# Patient Record
Sex: Female | Born: 1995 | Race: White | Hispanic: No | Marital: Single | Smoking: Never smoker
Health system: Southern US, Community
[De-identification: ages and names within clinical notes are randomized; demographics above are authoritative.]

## PROBLEM LIST (undated history)

## (undated) ENCOUNTER — Emergency Department: Admission: EM | Payer: Commercial Managed Care - POS | Source: Home / Self Care

## (undated) DIAGNOSIS — F32A Depression, unspecified: Secondary | ICD-10-CM

## (undated) DIAGNOSIS — J342 Deviated nasal septum: Secondary | ICD-10-CM

## (undated) DIAGNOSIS — U071 COVID-19: Secondary | ICD-10-CM

## (undated) DIAGNOSIS — F419 Anxiety disorder, unspecified: Secondary | ICD-10-CM

## (undated) DIAGNOSIS — R55 Syncope and collapse: Secondary | ICD-10-CM

## (undated) DIAGNOSIS — N83209 Unspecified ovarian cyst, unspecified side: Secondary | ICD-10-CM

## (undated) HISTORY — PX: TONSILLECTOMY: SUR1361

## (undated) HISTORY — DX: Unspecified ovarian cyst, unspecified side: N83.209

## (undated) HISTORY — DX: Depression, unspecified: F32.A

## (undated) HISTORY — PX: CHOLECYSTECTOMY: SHX55

---
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
## 2017-05-11 ENCOUNTER — Encounter: Payer: Self-pay | Admitting: Emergency Medicine

## 2017-05-11 ENCOUNTER — Emergency Department: Payer: Managed Care, Other (non HMO)

## 2017-05-11 ENCOUNTER — Emergency Department
Admission: EM | Admit: 2017-05-11 | Discharge: 2017-05-11 | Disposition: A | Discharge status: Home / Self Care | Payer: Managed Care, Other (non HMO) | Attending: Emergency Medicine | Admitting: Emergency Medicine

## 2017-05-11 DIAGNOSIS — J069 Acute upper respiratory infection, unspecified: Secondary | ICD-10-CM | POA: Insufficient documentation

## 2017-05-11 DIAGNOSIS — B9789 Other viral agents as the cause of diseases classified elsewhere: Secondary | ICD-10-CM

## 2017-05-11 DIAGNOSIS — B349 Viral infection, unspecified: Secondary | ICD-10-CM | POA: Diagnosis not present

## 2017-05-11 DIAGNOSIS — J029 Acute pharyngitis, unspecified: Secondary | ICD-10-CM | POA: Diagnosis present

## 2017-05-11 MED ORDER — ONDANSETRON 4 MG PO TBDP
4.0000 mg | ORAL_TABLET | Freq: Once | ORAL | Status: AC
  Administered 2017-05-11: 4 mg via ORAL
  Filled 2017-05-11: qty 1

## 2017-05-11 MED ORDER — FLUTICASONE PROPIONATE 50 MCG/ACT NA SUSP: 2.0000 | Freq: Every day | NASAL | 0 refills | Status: DC

## 2017-05-11 MED ORDER — DEXAMETHASONE SODIUM PHOSPHATE 10 MG/ML IJ SOLN
10.0000 mg | Freq: Once | INTRAMUSCULAR | Status: AC
  Administered 2017-05-11: 10 mg via INTRAMUSCULAR
  Filled 2017-05-11: qty 1

## 2017-05-11 MED ORDER — PREDNISONE 10 MG (21) PO TBPK: ORAL_TABLET | ORAL | 0 refills | Status: DC

## 2017-05-11 MED ORDER — BENZONATATE 100 MG PO CAPS: ORAL_CAPSULE | ORAL | 0 refills | Status: DC

## 2017-05-11 MED ORDER — ONDANSETRON 4 MG PO TBDP: 4.0000 mg | ORAL_TABLET | Freq: Three times a day (TID) | ORAL | 0 refills | Status: DC | PRN

## 2017-05-11 MED ORDER — ALBUTEROL SULFATE HFA 108 (90 BASE) MCG/ACT IN AERS: 2.0000 | INHALATION_SPRAY | RESPIRATORY_TRACT | 0 refills | Status: DC | PRN

## 2017-05-11 MED ORDER — IPRATROPIUM-ALBUTEROL 0.5-2.5 (3) MG/3ML IN SOLN
3.0000 mL | Freq: Once | RESPIRATORY_TRACT | Status: AC
  Administered 2017-05-11: 3 mL via RESPIRATORY_TRACT
  Filled 2017-05-11: qty 3

## 2017-05-11 NOTE — Discharge Instructions (Signed)
Your exam and chest x-ray are negative for pneumonia or any other infectious process. You may be experiencing a viral infection. Take the prescription meds as directed. Start an OTC decongestant (pseudoephedrine) and consider a daily non-drowsy allergy medicine (Allegra, Claritin, or Zyrtec). You could take Benadryl (diphenhydramine) at bedtime for sleep aid and nasal drainage relief. Follow-up with [facility] for surgical consultation.

## 2017-05-11 NOTE — ED Triage Notes (Signed)
Pt states sore throat and sinus pressure for 2 weeks now. No distress noted.

## 2017-05-11 NOTE — ED Notes (Signed)
Patient presents to the ED with low grade fever and sore throat x 2 weeks.  Patient reports pain with swallowing.  Patient states she has had her tonsils removed.  Patient reports history of frequent sinus infections but states this feels worse.

## 2017-05-11 NOTE — ED Provider Notes (Signed)
[facility] Emergency Department Provider Note ____________________________________________  Time seen: 1604  I have reviewed the triage vital signs and the nursing notes.  HISTORY  Chief Complaint  Sore Throat and Facial Pain  HPI Karen David is a 21 y.o. female presents to the ED for evaluation of a 2-week complaint of sore throat,sinus pressure, post-nasal drainage, and intermittently productive cough. She describes she's also had some blood-tinged sputum associated with a productive cough.Patient denies any interim frank fevers, but does note body aches, fatigue, chills, and loss of appetite. He is been taking Mucinex over-the-counter, Tylenol, Motrin, and use saline nasal flush. He is a Consulting civil engineer at [employer] but denies any recent travel, sick contacts, or other exposures. She reports that the health clinic at the school has a pending Monospot test which was drawn today. Denies any vomiting, diarrhea, abdominal pain, or chest pain. She does report nausea.   History reviewed. No pertinent past medical history.  There are no active problems to display for this patient.   Past Surgical History:  Procedure Laterality Date  . CHOLECYSTECTOMY    . TONSILLECTOMY      Prior to Admission medications   Medication Sig Start Date End Date Taking? Authorizing Provider  albuterol (PROVENTIL HFA;VENTOLIN HFA) 108 (90 Base) MCG/ACT inhaler Inhale 2 puffs into the lungs every 4 (four) hours as needed for wheezing or shortness of breath. 05/11/17   Rosslyn Pasion, Charlesetta Ivory, PA-C  benzonatate (TESSALON PERLES) 100 MG capsule Take 1-2 tabs TID prn cough 05/11/17   Jonel Sick, Charlesetta Ivory, PA-C  fluticasone (FLONASE) 50 MCG/ACT nasal spray Place 2 sprays into both nostrils daily. 05/11/17   Maryjean Corpening, Charlesetta Ivory, PA-C  ondansetron (ZOFRAN ODT) 4 MG disintegrating tablet Take 1 tablet (4 mg total) by mouth every 8 (eight) hours as needed. 05/11/17   Genni Buske, Charlesetta Ivory, PA-C   predniSONE (STERAPRED UNI-PAK 21 TAB) 10 MG (21) TBPK tablet 6-day taper as directed. 05/11/17   Domique Reardon, Charlesetta Ivory, PA-C    Allergies Patient has no known allergies.  No family history on file.  Social History Social History  Substance Use Topics  . Smoking status: Never Smoker  . Smokeless tobacco: Never Used  . Alcohol use No    Review of Systems  Constitutional: Negative for fever. Reports chills Eyes: Negative for visual changes. ENT: Positive for sore throat, nasal congestion, and sinus pressure. Cardiovascular: Negative for chest pain. Respiratory: Negative for shortness of breath. Reports productive cough Gastrointestinal: Negative for abdominal pain, vomiting and diarrhea. Genitourinary: Negative for dysuria. Musculoskeletal: Negative for back pain. Skin: Negative for rash. Neurological: Negative for headaches, focal weakness or numbness. ____________________________________________  PHYSICAL EXAM:  VITAL SIGNS: ED Triage Vitals  Enc Vitals Group     BP 05/11/17 1554 98/74     Pulse Rate 05/11/17 1554 99     Resp --      Temp 05/11/17 1554 98.5 F (36.9 C)     Temp Source 05/11/17 1554 Oral     SpO2 05/11/17 1554 98 %     Weight 05/11/17 1552 140 lb (63.5 kg)     Height 05/11/17 1552  (1.651 m)     Head Circumference --      Peak Flow --      Pain Score 05/11/17 1551 6     Pain Loc --      Pain Edu? --      Excl. in GC? --  Constitutional: Alert and oriented. Well appearing and in no distress. Head: Normocephalic and atraumatic. Eyes: Conjunctivae are normal. PERRL. Normal extraocular movements Ears: Canals clear. TMs intact bilaterally. Nose: No congestion/rhinorrhea/epistaxis. Nasal septal deviation noted. Right nostril with edematous, enlarged turbinates.  Mouth/Throat: Mucous membranes are moist. Uvula is midline and tonsils are flat. No oropharyngeal lesions are appreciated. Patient with posterior oropharynx cobblestoning and  injection noted. Neck: Supple. No thyromegaly. Hematological/Lymphatic/Immunological: No cervical lymphadenopathy. Cardiovascular: Normal rate, regular rhythm. Normal distal pulses. Respiratory: Normal respiratory effort. No wheezes/rales/rhonchi. Gastrointestinal: Soft and nontender. No distention. ____________________________________________   RADIOLOGY  CXR  IMPRESSION: Negative.  No active cardiopulmonary disease. ____________________________________________  PROCEDURES  DuoNeb x 1  Decadron 10 mg IM ____________________________________________  INITIAL IMPRESSION / ASSESSMENT AND PLAN / ED COURSE  Patient with ED evaluation of sore throat, sinus pressure, and intermittently productive cough for the last 2 weeks. Her exam is overall benign and she shows no acute infectious process on her chest x-ray. Patient's symptoms appear to be consistent with a viral URI and bronchitis. As such, she'll be discharged with a prescription for Flonase, Tessalon Perles, albuterol inhaler, and prednisone taper pack. She is advised to start an over-the-counter allergy medicine and decongestant for additional symptom relief. She is referred to [facility] for ongoing symptom management, and surgical consultation for her chronic sinusitis and nasal septal deviation. ____________________________________________  FINAL CLINICAL IMPRESSION(S) / ED DIAGNOSES  Final diagnoses:  Viral URI with cough      Karmen Stabs, Charlesetta Ivory, PA-C 05/11/17 1847    Myrna Blazer, MD 05/11/17 2006

## 2017-11-20 ENCOUNTER — Other Ambulatory Visit: Payer: Self-pay

## 2017-11-20 ENCOUNTER — Emergency Department
Admission: EM | Admit: 2017-11-20 | Discharge: 2017-11-20 | Disposition: A | Discharge status: Home / Self Care | Payer: Managed Care, Other (non HMO) | Attending: Emergency Medicine | Admitting: Emergency Medicine

## 2017-11-20 ENCOUNTER — Encounter: Payer: Self-pay | Admitting: Emergency Medicine

## 2017-11-20 DIAGNOSIS — R300 Dysuria: Secondary | ICD-10-CM

## 2017-11-20 DIAGNOSIS — N3001 Acute cystitis with hematuria: Secondary | ICD-10-CM | POA: Diagnosis not present

## 2017-11-20 DIAGNOSIS — Z79899 Other long term (current) drug therapy: Secondary | ICD-10-CM | POA: Insufficient documentation

## 2017-11-20 HISTORY — DX: Anxiety disorder, unspecified: F41.9

## 2017-11-20 LAB — URINALYSIS, COMPLETE (UACMP) WITH MICROSCOPIC
Bilirubin Urine: NEGATIVE
Glucose, UA: NEGATIVE mg/dL
Ketones, ur: NEGATIVE mg/dL
Nitrite: NEGATIVE
Protein, ur: NEGATIVE mg/dL
Specific Gravity, Urine: 1.015 (ref 1.005–1.030)
pH: 6 (ref 5.0–8.0)

## 2017-11-20 LAB — POCT PREGNANCY, URINE: Preg Test, Ur: NEGATIVE

## 2017-11-20 MED ORDER — CEPHALEXIN 500 MG PO CAPS: 500.0000 mg | ORAL_CAPSULE | Freq: Two times a day (BID) | ORAL | 0 refills | Status: AC

## 2017-11-20 MED ORDER — FLAVOXATE HCL 100 MG PO TABS: 100.0000 mg | ORAL_TABLET | Freq: Three times a day (TID) | ORAL | 0 refills | Status: AC | PRN

## 2017-11-20 MED ORDER — CEPHALEXIN 500 MG PO CAPS
500.0000 mg | ORAL_CAPSULE | Freq: Once | ORAL | Status: AC
  Administered 2017-11-20: 500 mg via ORAL
  Filled 2017-11-20: qty 1

## 2017-11-20 NOTE — ED Triage Notes (Signed)
Pt reports burning with urination and frequency since Friday; pt reports cloudy urine; denies fever;

## 2017-11-20 NOTE — ED Provider Notes (Signed)
[facility] Emergency Department Provider Note ____________________________________________  Time seen: 2025  I have reviewed the triage vital signs and the nursing notes.  HISTORY  Chief Complaint  Urinary Frequency and Dysuria  HPI Karen David is a 22 y.o. female presents to the ED for evaluation of dysuria and frequency.  Patient describes onset since Friday, and notes symptoms that include cloudy urine and burning with urination.  She denies any fevers, chills, sweats, nausea, or pelvic pain.  She also denies any abnormal vaginal bleeding or vaginal discharge.  Past Medical History:  Diagnosis Date  . Anxiety     There are no active problems to display for this patient.   Past Surgical History:  Procedure Laterality Date  . CHOLECYSTECTOMY    . TONSILLECTOMY      Prior to Admission medications   Medication Sig Start Date End Date Taking? Authorizing Provider  folic acid (FOLVITE) 1 MG tablet Take 1 mg by mouth daily.   Yes [provider]  sertraline (ZOLOFT) 100 MG tablet Take 100 mg by mouth daily.   Yes [provider]  cephALEXin (KEFLEX) 500 MG capsule Take 1 capsule (500 mg total) by mouth 2 (two) times daily for 7 days. 11/20/17 11/27/17  Jayel Inks, Charlesetta IvoryJenise V Bacon, PA-C  flavoxATE (URISPAS) 100 MG tablet Take 1 tablet (100 mg total) by mouth 3 (three) times daily as needed for bladder spasms. 11/20/17   Glenn Christo, Charlesetta IvoryJenise V Bacon, PA-C    Allergies Patient has no known allergies.  History reviewed. No pertinent family history.  Social History Social History   Tobacco Use  . Smoking status: Never Smoker  . Smokeless tobacco: Never Used  Substance Use Topics  . Alcohol use: No  . Drug use: Not Currently    Review of Systems  Constitutional: Negative for fever. Cardiovascular: Negative for chest pain. Respiratory: Negative for shortness of breath. Gastrointestinal: Negative for abdominal pain, vomiting and  diarrhea. Genitourinary: Positive for dysuria. Musculoskeletal: Negative for back pain. Skin: Negative for rash. Neurological: Negative for headaches, focal weakness or numbness. ____________________________________________  PHYSICAL EXAM:  VITAL SIGNS: ED Triage Vitals  Enc Vitals Group     BP 11/20/17 1954 128/80     Pulse Rate 11/20/17 1954 85     Resp 11/20/17 1954 18     Temp 11/20/17 1954 97.8 F (36.6 C)     Temp Source 11/20/17 1954 Oral     SpO2 11/20/17 1954 99 %     Weight 11/20/17 1954 140 lb (63.5 kg)     Height 11/20/17 1954 5\' 4"  (1.626 m)     Head Circumference --      Peak Flow --      Pain Score 11/20/17 2006 6     Pain Loc --      Pain Edu? --      Excl. in GC? --     Constitutional: Alert and oriented. Well appearing and in no distress. Head: Normocephalic and atraumatic. Cardiovascular: Normal rate, regular rhythm. Normal distal pulses. Respiratory: Normal respiratory effort. No wheezes/rales/rhonchi. Gastrointestinal: Soft and nontender. No distention.  No CVA tenderness appreciated. Musculoskeletal: Nontender with normal range of motion in all extremities.  Neurologic:  Normal gait without ataxia. Normal speech and language. No gross focal neurologic deficits are appreciated. ____________________________________________   LABS (pertinent positives/negatives)  Labs Reviewed  URINALYSIS, COMPLETE (UACMP) WITH MICROSCOPIC - Abnormal; Notable for the following components:      Result Value   Color, Urine YELLOW (*)  APPearance CLOUDY (*)    Hgb urine dipstick MODERATE (*)    Leukocytes, UA LARGE (*)    Bacteria, UA RARE (*)    Squamous Epithelial / LPF 0-5 (*)    All other components within normal limits  URINE CULTURE  POCT PREGNANCY, URINE  POC URINE PREG, ED  ____________________________________________  PROCEDURES  Procedures Keflex 500 mg PO ____________________________________________  INITIAL IMPRESSION / ASSESSMENT AND PLAN /  ED COURSE  She with ED evaluation of a 3-day complaint of dysuria and mild hematuria.  Her exam and labs are consistent with a cystitis.  She will be discharged with prescription for Keflex and Urispas to dose as directed.  She will follow-up with student health or return to the ED as needed. ____________________________________________  FINAL CLINICAL IMPRESSION(S) / ED DIAGNOSES  Final diagnoses:  Dysuria  Acute cystitis with hematuria      Khalel Alms, Charlesetta Ivory, PA-C 11/20/17 2123    Nita Sickle, MD 11/23/17 [phone]

## 2017-11-20 NOTE — ED Notes (Signed)
Pt states burning, frequency with urination x 2 days

## 2017-11-20 NOTE — ED Notes (Signed)
Urine collected in triage.

## 2017-11-20 NOTE — ED Notes (Signed)
Patient presents with college friend who has the same symptoms. Patient is able to answer questions appropriately and is ambulatory with a steady gait.

## 2017-11-20 NOTE — Discharge Instructions (Addendum)
Your exam and labs are consistent with a UTI. Take the antibiotic as directed and the bladder spasm medicine as needed. Drink plenty of non-carbonated drinks and empty your bladder on schedule. Follow-up with [facility] as needed.

## 2017-11-23 LAB — URINE CULTURE
Culture: >=100000 — AB
Special Requests: NORMAL

## 2018-06-16 ENCOUNTER — Emergency Department
Admission: EM | Admit: 2018-06-16 | Discharge: 2018-06-16 | Disposition: A | Discharge status: Home / Self Care | Payer: Managed Care, Other (non HMO) | Attending: Emergency Medicine | Admitting: Emergency Medicine

## 2018-06-16 ENCOUNTER — Other Ambulatory Visit: Payer: Self-pay

## 2018-06-16 ENCOUNTER — Encounter: Payer: Self-pay | Admitting: *Deleted

## 2018-06-16 DIAGNOSIS — R221 Localized swelling, mass and lump, neck: Secondary | ICD-10-CM | POA: Insufficient documentation

## 2018-06-16 DIAGNOSIS — Z5321 Procedure and treatment not carried out due to patient leaving prior to being seen by health care provider: Secondary | ICD-10-CM | POA: Insufficient documentation

## 2018-06-16 LAB — GROUP A STREP BY PCR: Group A Strep by PCR: NOT DETECTED

## 2018-06-16 NOTE — ED Notes (Signed)
Pt to front desk asking about wait times. Pt verbalized no desire to stay for long wait times and let with friend. Pt in NAD at time of discharge

## 2018-06-16 NOTE — ED Triage Notes (Signed)
Pt to ED reporting swollen lymph nodes in her throat that pt reports have been tender. Pt is whispering while checking in the lobby. No respiratory distress noted. No increased WOB.

## 2018-07-24 ENCOUNTER — Encounter: Payer: Self-pay | Admitting: *Deleted

## 2018-07-24 ENCOUNTER — Emergency Department: Payer: Managed Care, Other (non HMO)

## 2018-07-24 ENCOUNTER — Emergency Department
Admission: EM | Admit: 2018-07-24 | Discharge: 2018-07-24 | Disposition: A | Discharge status: Home / Self Care | Payer: Managed Care, Other (non HMO) | Attending: Emergency Medicine | Admitting: Emergency Medicine

## 2018-07-24 ENCOUNTER — Other Ambulatory Visit: Payer: Self-pay

## 2018-07-24 DIAGNOSIS — S022XXA Fracture of nasal bones, initial encounter for closed fracture: Secondary | ICD-10-CM | POA: Insufficient documentation

## 2018-07-24 DIAGNOSIS — W01198A Fall on same level from slipping, tripping and stumbling with subsequent striking against other object, initial encounter: Secondary | ICD-10-CM | POA: Diagnosis not present

## 2018-07-24 DIAGNOSIS — R42 Dizziness and giddiness: Secondary | ICD-10-CM | POA: Diagnosis not present

## 2018-07-24 DIAGNOSIS — Y998 Other external cause status: Secondary | ICD-10-CM | POA: Insufficient documentation

## 2018-07-24 DIAGNOSIS — Y9389 Activity, other specified: Secondary | ICD-10-CM | POA: Diagnosis not present

## 2018-07-24 DIAGNOSIS — Z79899 Other long term (current) drug therapy: Secondary | ICD-10-CM | POA: Insufficient documentation

## 2018-07-24 DIAGNOSIS — W19XXXA Unspecified fall, initial encounter: Secondary | ICD-10-CM

## 2018-07-24 DIAGNOSIS — Y92018 Other place in single-family (private) house as the place of occurrence of the external cause: Secondary | ICD-10-CM | POA: Diagnosis not present

## 2018-07-24 DIAGNOSIS — S0990XA Unspecified injury of head, initial encounter: Secondary | ICD-10-CM | POA: Insufficient documentation

## 2018-07-24 DIAGNOSIS — R110 Nausea: Secondary | ICD-10-CM | POA: Diagnosis not present

## 2018-07-24 LAB — URINALYSIS, COMPLETE (UACMP) WITH MICROSCOPIC
Bilirubin Urine: NEGATIVE
Glucose, UA: NEGATIVE mg/dL
Hgb urine dipstick: NEGATIVE
Ketones, ur: NEGATIVE mg/dL
Leukocytes, UA: NEGATIVE
Nitrite: NEGATIVE
Protein, ur: NEGATIVE mg/dL
Specific Gravity, Urine: 1.013 (ref 1.005–1.030)
pH: 7 (ref 5.0–8.0)

## 2018-07-24 LAB — BASIC METABOLIC PANEL
Anion gap: 7 (ref 5–15)
BUN: 10 mg/dL (ref 6–20)
CO2: 26 mmol/L (ref 22–32)
Calcium: 9.2 mg/dL (ref 8.9–10.3)
Chloride: 106 mmol/L (ref 98–111)
Creatinine, Ser: 0.66 mg/dL (ref 0.44–1.00)
GFR calc Af Amer: >60 mL/min (ref >60)
GFR calc non Af Amer: >60 mL/min (ref >60)
Glucose, Bld: 104 mg/dL — ABNORMAL HIGH (ref 70–99)
Potassium: 3.5 mmol/L (ref 3.5–5.1)
Sodium: 139 mmol/L (ref 135–145)

## 2018-07-24 LAB — CBC WITH DIFFERENTIAL/PLATELET
Abs Immature Granulocytes: 0.05 10*3/uL (ref 0.00–0.07)
Basophils Absolute: 0.1 10*3/uL (ref 0.0–0.1)
Basophils Relative: 1 %
Eosinophils Absolute: 0 10*3/uL (ref 0.0–0.5)
Eosinophils Relative: 0 %
HCT: 38.8 % (ref 36.0–46.0)
Hemoglobin: 12.1 g/dL (ref 12.0–15.0)
Immature Granulocytes: 1 %
Lymphocytes Relative: 16 %
Lymphs Abs: 1.7 10*3/uL (ref 0.7–4.0)
MCH: 25.6 pg — ABNORMAL LOW (ref 26.0–34.0)
MCHC: 31.2 g/dL (ref 30.0–36.0)
MCV: 82 fL (ref 80.0–100.0)
Monocytes Absolute: 0.6 10*3/uL (ref 0.1–1.0)
Monocytes Relative: 6 %
Neutro Abs: 7.9 10*3/uL — ABNORMAL HIGH (ref 1.7–7.7)
Neutrophils Relative %: 76 %
Platelets: 366 10*3/uL (ref 150–400)
RBC: 4.73 MIL/uL (ref 3.87–5.11)
RDW: 15.7 % — ABNORMAL HIGH (ref 11.5–15.5)
WBC: 10.4 10*3/uL (ref 4.0–10.5)
nRBC: 0 % (ref 0.0–0.2)

## 2018-07-24 MED ORDER — IBUPROFEN 600 MG PO TABS: 600.0000 mg | ORAL_TABLET | Freq: Three times a day (TID) | ORAL | 0 refills | Status: AC | PRN

## 2018-07-24 MED ORDER — HYDROCODONE-ACETAMINOPHEN 5-325 MG PO TABS: 1.0000 | ORAL_TABLET | Freq: Four times a day (QID) | ORAL | 0 refills | Status: AC | PRN

## 2018-07-24 MED ORDER — ONDANSETRON HCL 4 MG/2ML IJ SOLN
4.0000 mg | Freq: Once | INTRAMUSCULAR | Status: AC
  Administered 2018-07-24: 4 mg via INTRAVENOUS
  Filled 2018-07-24: qty 2

## 2018-07-24 MED ORDER — SODIUM CHLORIDE 0.9 % IV BOLUS
1000.0000 mL | Freq: Once | INTRAVENOUS | Status: AC
  Administered 2018-07-24: 1000 mL via INTRAVENOUS

## 2018-07-24 MED ORDER — ONDANSETRON 4 MG PO TBDP: 4.0000 mg | ORAL_TABLET | Freq: Three times a day (TID) | ORAL | 0 refills | Status: AC | PRN

## 2018-07-24 NOTE — ED Triage Notes (Signed)
Per EMS pt was walking on a wet floor and slipped and hit her head on the concrete floor. Pt with LOC per roommates and was dizzy and "in and out" for about 10 min. Pt is presently A&O x4 and talking to mom on the phone

## 2018-07-24 NOTE — Discharge Instructions (Signed)
1.  You may take pain medicines as needed (Motrin/Norco #15). 2.  You may take Zofran as needed for nausea or vomiting. 3.  Apply ice to affected area several times daily to reduce swelling. 4.  Return to the ER for worsening symptoms, persistent vomiting, lethargy or other concerns.

## 2018-07-24 NOTE — ED Provider Notes (Signed)
[facility] Emergency Department Provider Note   ____________________________________________   First MD Initiated Contact with Patient 07/24/18 0134     (approximate)  I have reviewed the triage vital signs and the nursing notes.   HISTORY  Chief Complaint Fall    HPI Karen David is a 22 y.o. female brought to the ED via EMS from [facility] status post mechanical fall.  Patient states she was walking on a wet floor that her roommate had recently mopped, slipped and fell, striking her head on the concrete floor.  Roommates report brief LOC.  Patient states she was dizzy and "in and out" for about 10 minutes.  Presents with nasal injury and nausea.  Denies vision changes, neck pain, chest pain, shortness of breath, abdominal pain, vomiting.  Tetanus is up-to-date.  Denies use of anticoagulants.  Denies recent alcohol use.  Currently on antibiotics for sinus infection.   Past Medical History:  Diagnosis Date  . Anxiety   Deviated septum  There are no active problems to display for this patient.   Past Surgical History:  Procedure Laterality Date  . CHOLECYSTECTOMY    . TONSILLECTOMY      Prior to Admission medications   Medication Sig Start Date End Date Taking? Authorizing Provider  flavoxATE (URISPAS) 100 MG tablet Take 1 tablet (100 mg total) by mouth 3 (three) times daily as needed for bladder spasms. 11/20/17   Menshew, Charlesetta Ivory, PA-C  folic acid (FOLVITE) 1 MG tablet Take 1 mg by mouth daily.    [provider]  HYDROcodone-acetaminophen (NORCO) 5-325 MG tablet Take 1 tablet by mouth every 6 (six) hours as needed for moderate pain. 07/24/18   Irean Hong, MD  ibuprofen (ADVIL,MOTRIN) 600 MG tablet Take 1 tablet (600 mg total) by mouth every 8 (eight) hours as needed. 07/24/18   Irean Hong, MD  ondansetron (ZOFRAN ODT) 4 MG disintegrating tablet Take 1 tablet (4 mg total) by mouth every 8 (eight) hours as needed for nausea  or vomiting. 07/24/18   Irean Hong, MD  sertraline (ZOLOFT) 100 MG tablet Take 100 mg by mouth daily.    [provider]    Allergies Patient has no known allergies.  No family history on file.  Social History Social History   Tobacco Use  . Smoking status: Never Smoker  . Smokeless tobacco: Never Used  Substance Use Topics  . Alcohol use: No  . Drug use: Not Currently    Review of Systems  Constitutional: No fever/chills Eyes: No visual changes. ENT: Positive for facial injury.  No sore throat. Cardiovascular: Denies chest pain. Respiratory: Denies shortness of breath. Gastrointestinal: No abdominal pain.  No nausea, no vomiting.  No diarrhea.  No constipation. Genitourinary: Negative for dysuria. Musculoskeletal: Negative for back pain. Skin: Negative for rash. Neurological: Negative for headaches, focal weakness or numbness.   ____________________________________________   PHYSICAL EXAM:  VITAL SIGNS: ED Triage Vitals  Enc Vitals Group     BP 07/24/18 0120 (!) 106/51     Pulse --      Resp 07/24/18 0120 16     Temp 07/24/18 0120 98.2 F (36.8 C)     Temp Source 07/24/18 0120 Oral     SpO2 --      Weight 07/24/18 0121 135 lb (61.2 kg)     Height 07/24/18 0121 5\' 4"  (1.626 m)     Head Circumference --      Peak Flow --  Pain Score 07/24/18 0120 8     Pain Loc --      Pain Edu? --      Excl. in GC? --     Constitutional: Alert and oriented. Well appearing and in mild acute distress. Eyes: Conjunctivae are normal. PERRL. EOMI. Head: Atraumatic. Nose: Abrasion to nasal bridge.  Mild swelling noted. Mouth/Throat: Mucous membranes are moist.  No dental malocclusion. Neck: No stridor.  No cervical spine tenderness to palpation. Cardiovascular: Normal rate, regular rhythm. Grossly normal heart sounds.  Good peripheral circulation. Respiratory: Normal respiratory effort.  No retractions. Lungs CTAB. Gastrointestinal: Soft and nontender. No  distention. No abdominal bruits. No CVA tenderness. Musculoskeletal: No spinal tenderness to palpation.  No lower extremity tenderness nor edema.  No joint effusions. Neurologic: Alert and oriented x3.  CN II-XII grossly intact.  Normal speech and language. No gross focal neurologic deficits are appreciated.  MAE x4. Skin:  Skin is warm, dry and intact. No rash noted. Psychiatric: Mood and affect are normal. Speech and behavior are normal.  ____________________________________________   LABS (all labs ordered are listed, but only abnormal results are displayed)  Labs Reviewed  CBC WITH DIFFERENTIAL/PLATELET - Abnormal; Notable for the following components:      Result Value   MCH 25.6 (*)    RDW 15.7 (*)    Neutro Abs 7.9 (*)    All other components within normal limits  BASIC METABOLIC PANEL - Abnormal; Notable for the following components:   Glucose, Bld 104 (*)    All other components within normal limits  URINALYSIS, COMPLETE (UACMP) WITH MICROSCOPIC - Abnormal; Notable for the following components:   Color, Urine YELLOW (*)    APPearance CLOUDY (*)    Bacteria, UA FEW (*)    All other components within normal limits   ____________________________________________  EKG  None ____________________________________________  RADIOLOGY  ED MD interpretation: No ICH, no cervical spine injuries, minimally displaced left nasal bone fracture  Official radiology report(s): Ct Head Wo Contrast  Result Date: 07/24/2018 CLINICAL DATA:  Initial evaluation for acute trauma, headache. EXAM: CT HEAD WITHOUT CONTRAST CT MAXILLOFACIAL WITHOUT CONTRAST CT CERVICAL SPINE WITHOUT CONTRAST TECHNIQUE: Multidetector CT imaging of the head, cervical spine, and maxillofacial structures were performed using the standard protocol without intravenous contrast. Multiplanar CT image reconstructions of the cervical spine and maxillofacial structures were also generated. COMPARISON:  None available.  FINDINGS: CT HEAD FINDINGS Brain: Cerebral volume within normal limits for patient age. No evidence for acute intracranial hemorrhage. No findings to suggest acute large vessel territory infarct. No mass lesion, midline shift, or mass effect. Ventricles are normal in size without evidence for hydrocephalus. No extra-axial fluid collection identified. Vascular: No hyperdense vessel identified. Skull: Scalp soft tissues demonstrate no acute abnormality. Calvarium intact. Sinuses/Orbits: Globes and orbital soft tissues within normal limits. Visualized paranasal sinuses are clear. No mastoid effusion. CT MAXILLOFACIAL FINDINGS Osseous: Zygomatic arches intact. No acute maxillary fracture. Pterygoid plates intact. Acute minimally displaced left nasal bone fracture (series 13, image 30). Nasal septum bowed to the right but intact. Mandible intact. Mandibular condyles normally situated. No acute abnormality about the dentition. Orbits: Globes and orbital soft tissues within normal limits. Bony orbits intact. Sinuses: Paranasal sinuses are clear. Soft tissues: Mild soft tissue irregularity/injury overlying the nasal bone fracture. No other acute soft tissue abnormality. CT CERVICAL SPINE FINDINGS The vertebral bodies are normally aligned with preservation of the normal cervical lordosis. Vertebral body heights are preserved. Normal C1-2 articulations are intact. No  prevertebral soft tissue swelling. No acute fracture or listhesis. Visualized soft tissues of the neck are within normal limits. Visualized lung apices are clear without evidence of apical pneumothorax. IMPRESSION: 1. No acute intracranial process identified. 2. Acute minimally displaced left nasal bone fracture. No other acute maxillofacial injury identified. 3. No acute traumatic injury within the cervical spine. Electronically Signed   By: Rise Mu M.D.   On: 07/24/2018 02:47   Ct Cervical Spine Wo Contrast  Result Date: 07/24/2018 CLINICAL  DATA:  Initial evaluation for acute trauma, headache. EXAM: CT HEAD WITHOUT CONTRAST CT MAXILLOFACIAL WITHOUT CONTRAST CT CERVICAL SPINE WITHOUT CONTRAST TECHNIQUE: Multidetector CT imaging of the head, cervical spine, and maxillofacial structures were performed using the standard protocol without intravenous contrast. Multiplanar CT image reconstructions of the cervical spine and maxillofacial structures were also generated. COMPARISON:  None available. FINDINGS: CT HEAD FINDINGS Brain: Cerebral volume within normal limits for patient age. No evidence for acute intracranial hemorrhage. No findings to suggest acute large vessel territory infarct. No mass lesion, midline shift, or mass effect. Ventricles are normal in size without evidence for hydrocephalus. No extra-axial fluid collection identified. Vascular: No hyperdense vessel identified. Skull: Scalp soft tissues demonstrate no acute abnormality. Calvarium intact. Sinuses/Orbits: Globes and orbital soft tissues within normal limits. Visualized paranasal sinuses are clear. No mastoid effusion. CT MAXILLOFACIAL FINDINGS Osseous: Zygomatic arches intact. No acute maxillary fracture. Pterygoid plates intact. Acute minimally displaced left nasal bone fracture (series 13, image 30). Nasal septum bowed to the right but intact. Mandible intact. Mandibular condyles normally situated. No acute abnormality about the dentition. Orbits: Globes and orbital soft tissues within normal limits. Bony orbits intact. Sinuses: Paranasal sinuses are clear. Soft tissues: Mild soft tissue irregularity/injury overlying the nasal bone fracture. No other acute soft tissue abnormality. CT CERVICAL SPINE FINDINGS The vertebral bodies are normally aligned with preservation of the normal cervical lordosis. Vertebral body heights are preserved. Normal C1-2 articulations are intact. No prevertebral soft tissue swelling. No acute fracture or listhesis. Visualized soft tissues of the neck are  within normal limits. Visualized lung apices are clear without evidence of apical pneumothorax. IMPRESSION: 1. No acute intracranial process identified. 2. Acute minimally displaced left nasal bone fracture. No other acute maxillofacial injury identified. 3. No acute traumatic injury within the cervical spine. Electronically Signed   By: Rise Mu M.D.   On: 07/24/2018 02:47   Ct Maxillofacial Wo Cm  Result Date: 07/24/2018 CLINICAL DATA:  Initial evaluation for acute trauma, headache. EXAM: CT HEAD WITHOUT CONTRAST CT MAXILLOFACIAL WITHOUT CONTRAST CT CERVICAL SPINE WITHOUT CONTRAST TECHNIQUE: Multidetector CT imaging of the head, cervical spine, and maxillofacial structures were performed using the standard protocol without intravenous contrast. Multiplanar CT image reconstructions of the cervical spine and maxillofacial structures were also generated. COMPARISON:  None available. FINDINGS: CT HEAD FINDINGS Brain: Cerebral volume within normal limits for patient age. No evidence for acute intracranial hemorrhage. No findings to suggest acute large vessel territory infarct. No mass lesion, midline shift, or mass effect. Ventricles are normal in size without evidence for hydrocephalus. No extra-axial fluid collection identified. Vascular: No hyperdense vessel identified. Skull: Scalp soft tissues demonstrate no acute abnormality. Calvarium intact. Sinuses/Orbits: Globes and orbital soft tissues within normal limits. Visualized paranasal sinuses are clear. No mastoid effusion. CT MAXILLOFACIAL FINDINGS Osseous: Zygomatic arches intact. No acute maxillary fracture. Pterygoid plates intact. Acute minimally displaced left nasal bone fracture (series 13, image 30). Nasal septum bowed to the right but intact. Mandible intact. Mandibular  condyles normally situated. No acute abnormality about the dentition. Orbits: Globes and orbital soft tissues within normal limits. Bony orbits intact. Sinuses: Paranasal  sinuses are clear. Soft tissues: Mild soft tissue irregularity/injury overlying the nasal bone fracture. No other acute soft tissue abnormality. CT CERVICAL SPINE FINDINGS The vertebral bodies are normally aligned with preservation of the normal cervical lordosis. Vertebral body heights are preserved. Normal C1-2 articulations are intact. No prevertebral soft tissue swelling. No acute fracture or listhesis. Visualized soft tissues of the neck are within normal limits. Visualized lung apices are clear without evidence of apical pneumothorax. IMPRESSION: 1. No acute intracranial process identified. 2. Acute minimally displaced left nasal bone fracture. No other acute maxillofacial injury identified. 3. No acute traumatic injury within the cervical spine. Electronically Signed   By: Rise Mu M.D.   On: 07/24/2018 02:47    ____________________________________________   PROCEDURES  Procedure(s) performed: None  Procedures  Critical Care performed: No  ____________________________________________   INITIAL IMPRESSION / ASSESSMENT AND PLAN / ED COURSE  As part of my medical decision making, I reviewed the following data within the electronic MEDICAL RECORD NUMBER Nursing notes reviewed and incorporated, Labs reviewed, Radiograph reviewed and Notes from prior ED visits   22 year old female who presents status post mechanical fall with facial injury.  Will obtain CT head/cervical spine/maxillofacial.  Initiate IV fluid resuscitation, 4 mg IV Zofran for nausea.  Will reassess.  Clinical Course as of Jul 24 426  Mon Jul 24, 2018  0330 Updated patient of all test results.  She is resting in no acute distress.  She will finish her IV fluids to be discharged home with prescriptions for Motrin, Norco and ODT Zofran to use as needed.  Will refer for ENT for outpatient follow-up.  She is to finish her antibiotics which she is already taking for sinusitis.  Strict head injury precautions given.   Patient and friend verbalize understanding and agree with plan of care.   [JS]    Clinical Course User Index [JS] Irean Hong, MD     ____________________________________________   FINAL CLINICAL IMPRESSION(S) / ED DIAGNOSES  Final diagnoses:  Fall, initial encounter  Injury of head, initial encounter  Closed fracture of nasal bone, initial encounter     ED Discharge Orders         Ordered    ibuprofen (ADVIL,MOTRIN) 600 MG tablet  Every 8 hours PRN     07/24/18 0400    HYDROcodone-acetaminophen (NORCO) 5-325 MG tablet  Every 6 hours PRN     07/24/18 0400    ondansetron (ZOFRAN ODT) 4 MG disintegrating tablet  Every 8 hours PRN     07/24/18 0400           Note:  This document was prepared using Dragon voice recognition software and may include unintentional dictation errors.    Irean Hong, MD 07/24/18 [phone]

## 2018-07-31 ENCOUNTER — Ambulatory Visit (INDEPENDENT_AMBULATORY_CARE_PROVIDER_SITE_OTHER): Payer: Commercial Managed Care - POS | Admitting: Clinical Neuropsychologist

## 2018-07-31 DIAGNOSIS — S060X0A Concussion without loss of consciousness, initial encounter: Secondary | ICD-10-CM | POA: Insufficient documentation

## 2018-07-31 NOTE — Progress Notes (Signed)
Procedures: See table below    Test Evaluation Services:   Units: 1 96132 Date:   07/31/2018  08/03/2018 Total Time: >31 minutes (did not exceed 90 minutes)   Activities: Medical record review (as available in EPIC), test selection, neuropsychological interpretation of standardized test results/clinical data, integration of patient data, clinical decision making, providing feedback to the patient regarding test findings/diagnostic formulation, providing necessary letters (Work, School), educating the patient about their condition to maximize patient collaboration in their care/future implications, coordination of care with other concussion team members involved in the case as well as referring providers and/or necessary personnel as needed (e.g., primary care sports medicine, case managers, athletic trainers, vestibular therapist, neuro-optometrist), responding to follow up questions from the patient, report composition/review and administrative tasks (e.g., scanning, copying, filing).    Additional intra-session clinical decision making:  Ashby Dawes of symptoms     Neurobehavioral Status Exam:    Units: 1 96116 Date:  07/31/2018 Total Time: >31 minutes (did not exceed 90 minutes)   Who attended: Patient her mother  Dr. Clarita Leber  Clinical Athletic Trainer - Ubaldo Glassing   Activities: Direct clinical observation and interview     Automated Testing and Result:   Units: 1 16109 Date:  07/31/2018 Total Time: >16 minutes (did not exceed 45 minutes)   Tests Administered and Scored: Immediate Post Concussion Assessment and Cognitive Testing (ImPACT)  PHQ-9  Vestibular/Ocular Motor Screening (VOMS) Assessment   Total Time For In-Office Visit (not including time spent before/after patient arrived/left): 90 minutes (1:15-2:45)    Subjective:   Description of Injury and Recovery (thus far):  Christina [name] is a 22 y.o. female who presented to the clinic today for the initial evaluation of a potential head injury sustained on  07/24/2018. Reportedly, the patient was injured when she slipped on a wet floor at her house, striking the front of her face. She denied a LOC and PTA. Immediate symptoms were reported as including nose pain. Shortly after the injury when she saw her nose, the patient experienced a syncopal episode and was "in and out" for the next 10 minutes. EMS was called and the patient was transported to the [facility] ER for evaluation [place], [place]). A CT of her head and c-spine were completed and read as negative for acute findings. A CT of her face was positive for an acute minimally displaced left nasal bone fracture. She was discharged the same day with prescriptions for Vicodin, XS-Tylenol, and Zofran. She was ultimately evaluated by [facility] a few days later, who diagnosed the patient with a concussion and notified her teachers. Upon return to the clinic for a re-evaluation 2 days later, she was sent home and told not to go to class due to ongoing symptoms. She was evaluated by ENT on  07/28/2018, with findings of a broken nose and will have surgery tomorrow for alignment. Since the initial injury date, symptoms have remained stable in terms of headaches and nausea. Academically, the patient has not completed any schoolwork since being home and has engaged in minimal physical activity. She discussed being very stressed in regards to getting her coursework done from the Fall semester prior to the Spring semester.     Injury Details:   Injury Type: Non-Sport   Loss of consciousness: Yes   Location of Contact: Frontal   Headgear: N/A   Amnesia- Yes   Confusion/ Disorientation- No   Immediate Dizziness:  No   Immediate Symptoms- nose pain    Immediate removal  from play:  N/A   Return to play same day:  N/A   CT Scan Completed:  Yes   If Yes: positive, nasal fracture   Previous Care: ER and PCP   Return to school/work next available day: No   First day of full school/work: Not currently attending  school/work   Club/ Team/ Organization: N/A   ImPACT Passport ID#: N/A    Referral Type: Emergency Department  Were you directly referred? Yes    Current Patient Status (Reported by Patient):   Feel overall since the injury?    Remaining stable  Sleeping since the injury?    Remaining stable    Since the onset of injury, have you added any medication?   Tylenol and ibuprofen  What is your chief complaint?       headaches    Does the patient report they have been prescribed strict rest by other providers since this injury?   yes   If yes, have the been following it? yes    Continued regular exercise from the time of injury until this evaluation? No  Level of physical activity:       No activity    Returned to school/work since the time of injury until this evaluation? No  Level of school/work involvement:      No school/work    Current Symptoms (last 24-72 hours):      Current physical symptoms: headaches (endorsed: location:Frontal; frequency: Constant with varying intensity with the night time being the worse; generally 6/10 severity), dizziness (endorsed: slow wavy; occasionally when getting up from bed), nausea (endorsed: constant), light sensitivity (endorsed: mild), noise sensitivity (denied), neck pain (denied), visual difficulties (endorsed: reading and computer screens increase eye strain), environmental sensitivity (denied), and increased fatigue (denied).     Current cognitive symptoms: mental fogginess (endorsed), feeling slowed down (endorsed), trouble concentrating (denied) and memory challenges (endorsed).     Current emotional changes: irritability (denied), sadness (denied), nervousness (endorsed: possibly), feeling more emotional (denied), and anxiety (endorsed: school related).     Current sleep difficulties: trouble falling asleep (endorsed: headaches), trouble staying asleep (denied), sleeping more than usual (endorsed: napping), and sleeping less than usual (denied).     Current  nutrition/hydration habits:   Eating Breakfast Yes   Eating Lunch Yes   Eating Dinner Yes, occasional   Snacking (i.e., grazing - no regular meals) N/A   Hydration Status inadequate     Physical/Social Activities:   Prior to the injury, the patient was involved in activities of daily life. Since the injury, physical and/or recreational activities have been limited. Social events have been limited. She is scheduled to leave on vacation in 10 days.    Biopsychosocial:   Personal history:   The patient reported a history of:  Concussion - No  Seizures-  No  Carsickness -Yes  Migraines - No  Headaches- No  Ocular Dysfunction - Yes , one eyelid sags  Glasses or Contacts - No  Anxiety -Yes , treated with Zoloft, not currently on the medication.   Depression -Yes  Diabetes -No    Family history:  The patient reported a family history of:  Carsickness - Yes mother  Headaches/Migraines -Yes mother  Ocular Dysfunction -No  Anxiety - Yes maternal and paternal  Depression - Yes maternal and paternal  Family History of Diabetes- Type 2, grandfather Type 1 for uncle, maternal side    Educational history:  The patient is currently a Medical laboratory scientific officer at [employer], studying psychology and human  services. Christina [name] reported making A-B's in school.     Highest Degree:high school diploma    History of ADHD - No  History of learning disability - No  History of being held back in school - Yes, 6th grade, by choice    Current Medications: The patient is using OTC medications to include tylenol and ibuprofen (frequency: daily, multiple times). She is also using Zofran 3 times in the past week.     Neurobehavioral Status Examination:  A neurobehavioral status examination was completed today. The patient was fully oriented to person, place and time. She was able to answer all questions appropriately and accurately.     Mental Status:    Appearance:   age appropriate  Behavior:   normal  Speech:   normal pitch and normal volume  Mood:     normal  Affect:    normal  Thought Process:  normal  Thought Content:  normal  Sensorium:               person, place and time/date   SI/HI:         no    Other Behavioral Observations: The patient attended the appointment with her mother today. The patient was pleasant and fully engaged in conversation.    The CP Screen was completed today            1. Feeling sad  Mild 1   2. Headache when you wake up  Moderate 2   3. Difficulty or headache when looking at phone or computer screen  Severe 3   4. Dizziness when you move your head  Moderate 2   5. Difficulty turning off your thoughts (e.g., rumination)  Moderate 2   6. Headache with nausea or upset stomach  Severe 3   7. Trouble focusing your eyes while reading  Severe 3   8. Frontal headache  Severe 3   9. Difficulty or discomfort in busy environments  Moderate 2   10. Constantly thinking about your symptoms  Severe 3   11. Headache with sensitivity to light or noise  Moderate 2   12. Feeling motion sick ("sea or car sick")  Moderate 2   13. Feeling more tired at the end of the day  Moderate 2   14. Blurry or double vision  Moderate 2   15. Feeling or sensation of slow wavy dizziness (i.e., lightheadedness)  Mild 1   16. Neck pain or stiffness  None 0   17. Sleeping more than usual  None 0   18. Sleeping less than usual  FALSE -1   19. Eye strain (eyes feel tired) during visual activities  Severe 3   20. Visual aura (e.g., flashes, stars, spots, flickering light) with or without headache  None 0   21. Feeling or sensation of fast spinning dizziness (i.e., vertigo)  None 0   22. Difficulty falling asleep  Severe 3   23. Difficulty staying asleep  None 0   24. Trouble remembering things (e.g., what you completed today or having to re-read information)  Moderate 2   25. Difficulty moving your neck  None 0   26. Feeling nervous or anxious  Moderate 2   27. Increased headache following physical activity  Severe 3   28. Increased headache following cognitive activity   Severe 3   29. Feeling more stressed than usual  Severe 3          Profile Scores:  Raw Average    Anxiety/Mood 11 73.33    Cognitive/Fatigue 7 77.78    Migraine 10 66.67    Ocular 14 93.33    Vestibular 7 46.67    Modifier Scores:       Raw Average    Sleep 2 16.67    Cervical 3 50         The BRAC was completed today            Slept your "normal" amount of sleep (typically 8-10 hours each night)  Some of the time   Eat three meals at consistent times throughout the day  Never    Drank ~8-10 glasses (8 oz) of fluids throughout the day  Seldom    Engaged in ~30 minutes of light physical activity each day  Some of the time   Engaged in stress regulation strategies each day (e.g., relaxation, breathing, meditation, etc.)  Most of the time         The PHQ-9 was completed today            Little interest or pleasure in doing things 0 Not at all    Feeling down, depressed, or hopeless 0 Not at all    Trouble falling/staying asleep, sleeping too much 3 Nearly every day   Feeling tired or having little energy 2 More than half the days   Poor appetite or overeating 3 Nearly every day   Feeling bad about yourself or that you are a failure or have let yourself or your family down 1 Several days   Trouble concentrating on things, such as reading the newspaper or watching television. 3 Nearly every day   Moving or speaking so slowly that other people could have noticed. Or the opposite; being so fidgety or restless that you have been moving around a lot more than usual. 2 More than half the days   Thoughts that you would be better off dead or of hurting yourself in some way. 0 Not at all    Total: 14     If you checked off any problems, how difficult have those problems made it for you to                                                                                      Do your work, take care of things at home or get along with other people? Very Difficult         Neuropsychological Test Results:  Christina [name] was  administered the Immediate Post-Concussion Assessment and Cognitive Testing (ImPACT) neuropsychological test and the Post-Concussion Symptom Scale (PCSS) on the computer. ImPACT raw scores and percentiles are reported below. The PCSS score ranges from 0-132 with higher scores reflecting report of greater symptom severity. Results were interpreted and discussed.    Domain Raw Score Percentile Comments   Verbal Memory Domain  69 7% Below Expectation; initial assessment   Visual Memory Domain 52 8% Low Average; initial assessment    Visual Motor Speed Domain 31.27 7% Below Expectation; initial assessment   Reaction Time Domain 0.66 21% Low Average; initial assessment   Impulse Control Domain 2  Pre-test Symptom Scale 64     Post-test Symptom Scale 58       Vestibular/Ocular Motor Screening (VOMS) Assessment Results:  Christina [name] was administered the VOMS assessment and results were discussed. Self-reported symptom severities (0-10) are reported below, with higher ratings reflecting greater symptom severity.    VOMS: Not Tested Headache Dizziness Nausea Fogginess Comments   Baseline Symptoms   8  2  6  4  Wearing Glasses or Contacts during assessment: No   Smooth Pursuits   8  0  6  2    Horizontal Saccades   8  2  7  2  Intrusions: no     Speed: Normal    Vertical Saccades   9  4 9  2  Intrusions: no     Speed: Normal   Convergence               9  4  9   0 Measure 1 (patient): 12 cm  Measure 2 (patient): 9 cm  Measure 3 (patient): 5 cm    Deviations: Yes, left   Horizontal VOR   9  4  9   0   Speed 180 bpm:No  Intrusions: no    Vertical VOR   9  2  9   0   Speed 180 bpm:No  Intrusions: no    Visual Motion Sensitivity   8  4  9   0   Speed 50 bpm: No  Intrusions: no      Additional Convergence/Accommodation Measurements:  Measure 1 (provider):  7   Measure 2 (provider): 6   Measure 3 (provider): 6   Recovery 7   Accommodation    Right: 9          Left: 9      Clinical Profile - Primary:  ocular   Clinical Profiles -  Secondary: vestibular   Clinical Profile - Tertiary: anxiety/mood   Comments Regarding Profiles: appears to be sleep component to current symptoms     Impression/ Plan:  Based on my evaluation today, it is my opinion that Christina [name] sustained a cerebral concussion on 07/24/2018. Today, Christina [name] presented with a moderate symptom profile with likely vestibular, ocular and anxiety/mood involvement in her ongoing symptomatology.  It is also very likely that her nose fracture is contributing to current symptoms. Neurocognitive data fell in the Below Expectation to Low Average performance ranges which is inconsistent with the patient's reported academic history. The patient and endorsed moderate symptoms of depression and admitted to very high stress levels today which could be contributing to overall symptoms. The VOMS assessment was provocative for a clinically significant change in symptoms. Convergence measurements were abnormal during the evaluation today. As such, it was recommended that the patient implement a regulated daily schedule while progressing with physical/cognitive activities. It is likely that she will need vestibular/ocular therapy and will be re-evaluated at her next appointment. The patient is scheduled to undergo a surgical procedure to correct her broken nose tomorrow. I have no contradictions from a concussion standpoint for that procedure and she was provided a letter reflecting that today. Academically, the patient should communicate with her school and set up a plan to complete missed work. In terms of physical activity, the patient will follow the guidelines of her nose surgeon, but from a concussion standpoint, she should engage in mild to moderate non-contact exertion as tolerated. Other recommendations discussed today included maintenance of a regulated schedule in terms of sleep, diet, hydration,  light physical activity, and stress maintenance (handout provided to the patient).  Additionally, the patient should follow the exposure-recovery model when resuming normal everyday activities by tolerating symptoms rated at a 1-4/10 severity and recovering from symptoms rated at a 5/10 severity or greater. I will plan to see the patient back in approximately 7-10 days, if healing from her nose surgery goes as expected, at which time I will make any further recommendations as needed.     Thank you for involving the [facility] in the care and evaluation of this patient.     Recommended Treatments: see above

## 2018-08-09 ENCOUNTER — Encounter (INDEPENDENT_AMBULATORY_CARE_PROVIDER_SITE_OTHER): Payer: Self-pay

## 2018-08-09 NOTE — Progress Notes (Signed)
Spoke with patient mother who is seeking concussion team near [facility] as patient returns to school soon.  Provided with [facility].  Phone# [phone]    Advised mother that if notes are needed to contact our office and we are happy to forward them over.  Patient/mother to call back as needed.

## 2018-08-23 HISTORY — PX: SEPTOPLASTY: SUR1290

## 2018-08-23 HISTORY — PX: RHINOPLASTY: SUR1284

## 2018-08-24 ENCOUNTER — Telehealth: Payer: Self-pay

## 2018-08-24 NOTE — Telephone Encounter (Signed)
Spoke with patient. She suffered a head injury on 07/23/18. She slipped on a wet floor in her apartment. Did lose consciousness. Was unable to finish finals. Is starting classes tomorrow. Has been having migraines and inability to concentrate since fall. No history of head injury. On schedule for Monday, 1/6.

## 2018-08-27 NOTE — Progress Notes (Signed)
Subjective:   I, Karen David, am serving as a scribe for Dr. Antoine PrimasZachary Smith.   Chief Complaint: Karen David, DOB: 1996/05/10, is a 23 y.o. female who presents for head injury sustained on 07/23/2018 when she slipped on her roommates floor. She did suffer an LOC. No history of concussion. Did return to one class last week. Is having migraines daily. Patient wakes up with and goes to sleep with a migraine. Does use Aleve when she is in pain. Did break her nose in the accident. Does have difficulty concentrating and wrote a paper that did not sound like her.    Injury date : 07/23/2018 Visit #: 1  Previous imagine.   History of Present Illness:    Concussion Self-Reported Symptom Score Symptoms rated on a scale 1-6, in last 24 hours  Headache: 6  Nausea: 4  Vomiting: 0  Balance Difficulty:2  Dizziness: 0  Fatigue: 3  Trouble Falling Asleep: 6  Sleep More Than Usual: 0  Sleep Less Than Usual: 5  Daytime Drowsiness: 0  Photophobia: 0  Phonophobia: 0  Irritability: 0  Sadness: 0  Nervousness: 3  Feeling More Emotional:3  Numbness or Tingling: 0  Feeling Slowed Down: 4  Feeling Mentally Foggy:0  Difficulty Concentrating: 0  Difficulty Remembering: 4  Visual Problems: 0    Total Symptom Score: 40   Review of Systems: Pertinent items are noted in HPI.  Review of History: Past Medical History:  Past Medical History:  Diagnosis Date  . Anxiety     Past Surgical History:  has a past surgical history that includes Cholecystectomy and Tonsillectomy. Family History: family history is not on file. no family history of autoimmune Social History:  reports that she has never smoked. She has never used smokeless tobacco. She reports previous drug use. She reports that she does not drink alcohol. Current Medications: has a current medication list which includes the following prescription(s): flavoxate, folic acid, hydrocodone-acetaminophen, ibuprofen, ondansetron, sertraline,  doxycycline, and vitamin d (ergocalciferol). Allergies: has No Known Allergies.  Objective:    Physical Examination Vitals:   08/28/18 1232  BP: 106/70  Pulse: 72  SpO2: 98%   General: No apparent distress alert and oriented x3 mood and affect normal, dressed appropriately.  HEENT: Pupils equal, extraocular movements intact mild deviated septum noted severe tenderness tenderness to palpation over the maxillary sinus on the left side to percussion. Respiratory: Patient's speak in full sentences and does not appear short of breath  Cardiovascular: No lower extremity edema, non tender, no erythema  Skin: Warm dry intact with no signs of infection or rash on extremities or on axial skeleton.  Abdomen: Soft nontender  Neuro: Cranial nerves II through XII are intact, neurovascularly intact in all extremities with 2+ DTRs and 2+ pulses.  Lymph: No lymphadenopathy of posterior or anterior cervical chain or axillae bilaterally.  Gait normal with good balance and coordination.  MSK:  Non tender with full range of motion and good stability and symmetric strength and tone of shoulders, elbows, wrist,  knee and ankles bilaterally.  Psychiatric: Oriented X3, intact recent and remote memory, judgement and insight, normal mood and affect   Neurocognitive testing (ImPACT):   Post #1:    Verbal Memory Composite  77 (89%)   Visual Memory Composite  66 (29%)   Visual Motor Speed Composite  37.30 (39%)   Reaction Time Composite  .61 (44%)   Cognitive Efficiency Index  .34    Vestibular Screening:  Headache  Dizziness  Smooth Pursuits y y  H. Saccades y y  V. Saccades y y  H. VOR n n  V. VOR y y          Convergence: 2cm  n n   Balance Screen: 30/30   Assessment:    No diagnosis found.     Plan:   Action/Discussion: Reviewed diagnosis, management options, expected outcomes, and the reasons for scheduled and emergent follow-up. Questions were adequately answered. Patient  expressed verbal understanding and agreement with the following plan.    I was personally involved with the physical evaluation of and am in agreement with the assessment and treatment plan for this patient.  Greater than 50% of this encounter was spent in direct consultation with the patient in evaluation, counseling, and coordination of care. Duration of encounter: 45 minutes.  After Visit Summary printed out and provided to patient as appropriate.

## 2018-08-28 ENCOUNTER — Encounter: Payer: Self-pay | Admitting: Family Medicine

## 2018-08-28 ENCOUNTER — Ambulatory Visit (INDEPENDENT_AMBULATORY_CARE_PROVIDER_SITE_OTHER): Payer: Managed Care, Other (non HMO) | Admitting: Family Medicine

## 2018-08-28 DIAGNOSIS — J329 Chronic sinusitis, unspecified: Secondary | ICD-10-CM | POA: Insufficient documentation

## 2018-08-28 DIAGNOSIS — J320 Chronic maxillary sinusitis: Secondary | ICD-10-CM | POA: Diagnosis not present

## 2018-08-28 MED ORDER — VITAMIN D (ERGOCALCIFEROL) 1.25 MG (50000 UNIT) PO CAPS: 50000.0000 [IU] | ORAL_CAPSULE | ORAL | 0 refills | Status: DC

## 2018-08-28 MED ORDER — DOXYCYCLINE HYCLATE 100 MG PO TABS: 100.0000 mg | ORAL_TABLET | Freq: Two times a day (BID) | ORAL | 0 refills | Status: AC

## 2018-08-28 NOTE — Assessment & Plan Note (Signed)
History of sinusitis. Patient did have percussion of the frontal sinuses more trouble.  Testing for the concussion seem to be fairly unremarkable actually.  Discussed with patient about antibiotic use and over-the-counter medications.  Given some mild restrictions at school for the next 2 weeks to make sure patient makes improvement.  Patient will follow-up again in 2 to 3 weeks to be released fully.

## 2018-08-28 NOTE — Patient Instructions (Addendum)
Good to see you  Doxycycline 2 times a day for 1 week Vitamin D once weekly for 4 weeks CoQ10 200mg  daily  Choline 500mg  daily  Fish oil 2 grams daiy Mild restrictions at school  See me again in 2-3 weeks

## 2018-09-18 ENCOUNTER — Other Ambulatory Visit: Payer: Self-pay | Admitting: Family Medicine

## 2018-09-18 ENCOUNTER — Ambulatory Visit: Payer: Managed Care, Other (non HMO) | Admitting: Family Medicine

## 2018-09-18 DIAGNOSIS — Z0289 Encounter for other administrative examinations: Secondary | ICD-10-CM

## 2018-09-18 NOTE — Progress Notes (Deleted)
Subjective:   @VITALSMCOMMENTS @  Chief Complaint: Karen David, DOB: 1996/05/22, is a 23 y.o. female who presents for No chief complaint on file.   Injury date : *** Visit #: ***  Previous imagine.   History of Present Illness:    Concussion Self-Reported Symptom Score Symptoms rated on a scale 1-6, in last 24 hours  Headache: ***    Nausea: ***  Vomiting: ***  Balance Difficulty: ***   Dizziness: ***  Fatigue: ***  Trouble Falling Asleep: ***   Sleep More Than Usual: ***  Sleep Less Than Usual: ***  Daytime Drowsiness: ***  Photophobia: ***  Phonophobia: ***  Feeling anxious: ***  Confused: ***  Irritability: ***  Sadness: ***  Nervousness: ***  Feeling More Emotional: ***  Numbness or Tingling: ***  Feeling Slowed Down: ***  Feeling Mentally Foggy: ***  Difficulty Concentrating: ***  Difficulty Remembering: ***  Visual Problems: ***  Neck Pain: ***  Tinnitus: ***   Total Symptom Score: *** Previous Symptom Score: ***  Review of Systems: Pertinent items are noted in HPI.  Review of History: Past Medical History: @PMHP @  Past Surgical History:  has a past surgical history that includes Cholecystectomy and Tonsillectomy. Family History: family history is not on file. no family history of autoimmune Social History:  reports that she has never smoked. She has never used smokeless tobacco. She reports previous drug use. She reports that she does not drink alcohol. Current Medications: has a current medication list which includes the following prescription(s): flavoxate, folic acid, hydrocodone-acetaminophen, ibuprofen, ondansetron, sertraline, and vitamin d (ergocalciferol). Allergies: has No Known Allergies.  Objective:    Physical Examination There were no vitals filed for this visit. General: No apparent distress alert and oriented x3 mood and affect normal, dressed appropriately.  HEENT: Pupils equal, extraocular movements intact  Respiratory:  Patient's speak in full sentences and does not appear short of breath  Cardiovascular: No lower extremity edema, non tender, no erythema  Skin: Warm dry intact with no signs of infection or rash on extremities or on axial skeleton.  Abdomen: Soft nontender  Neuro: Cranial nerves II through XII are intact, neurovascularly intact in all extremities with 2+ DTRs and 2+ pulses.  Lymph: No lymphadenopathy of posterior or anterior cervical chain or axillae bilaterally.  Gait normal with good balance and coordination.  MSK:  Non tender with full range of motion and good stability and symmetric strength and tone of shoulders, elbows, wrist,  knee and ankles bilaterally.  Psychiatric: Oriented X3, intact recent and remote memory, judgement and insight, normal mood and affect  Concussion testing performed today:  I spent *** minutes with patient discussing test and results including review of history and patient chart and  integration of patient data, interpretation of standardized test results and clinical data, clinical decision making, treatment planning and report,and interactive feedback to the patient with all of patients questions answered.    Neurocognitive testing (ImPACT):  Baseline:*** Post #1: ***   Verbal Memory Composite *** (***%) *** (***%)   Visual Memory Composite *** (***%) *** (***%)   Visual Motor Speed Composite *** (***%) *** (***%)   Reaction Time Composite *** (***%) *** (***%)   Cognitive Efficiency Index *** ***     Vestibular Screening:   Pre VOMS  HA Score: *** Pre VOMS  Dizziness Score: ***   Headache  Dizziness  Smooth Pursuits *** ***  H. Saccades *** ***  V. Saccades *** ***  H. VOR *** ***  V. VOR *** ***  Visual Motor Sensitivity *** ***  Accommodation Right: *** cm Left: *** cm *** ***  Convergence: *** cm Divergence: *** cm *** ***   Balance Screen: ***  Additional testing performed today: { :28529}   Assessment:    No diagnosis found.  Karen David presents with the following concussion subtypes. [] Cognitive [] Cervical [] Vestibular [] Ocular [] Migraine [] Anxiety/Mood   Plan:   Action/Discussion: Reviewed diagnosis, management options, expected outcomes, and the reasons for scheduled and emergent follow-up. Questions were adequately answered. Patient expressed verbal understanding and agreement with the following plan.      Participation in school/work: Patient is cleared to return to work/school and activities of daily living without restrictions.  Patient is not cleared to return to work/school until further notice.  Patient may return to work/school on ***, with the following restrictions/supports:  Extra Time:  Take mental rest breaks during the day as needed. Check for return of symptoms when participating in any activities that require a significant amount of attention or concentration.  Allow extra time to complete tasks.  Please allow *** weeks to make up missed assignments, test, quizzes.  Visual/Vestibular Accommodations in School:  Allow patient to eat lunch in quiet environment with 1-2 classmates.  Allow patient to leave class 5 minutes before end of period to avoid busy/noisy hallway.  Please provide any supplemental learning materials (power points, lecture notes, handouts, etc) in minimum size 18 font and allow/provide any auditory supplements to learning when possible (books on tape, audio tape lectures, etc) to limit visual stress in the classroom.  Patient is cleared for auditory participation only. Patient is not cleared for homework, quizzes, or tests at this time.   Testing:  May begin taking tests/quizzes on *** with no more than one test/quiz per day.   No significant classroom or standardized testing until ***.  Home/Extracurricular:  Lessen work/homework load to allow adequate cognitive rest. Work *** minutes with intervals of *** minute breaks (total *** hours).  Limit visual  stimulants including: driving, watching television/movies, reading, using cell phone, etc. - to ensure relative visual cognitive rest. NOT cleared for video or phone games. May participate *** minutes with intervals of *** minute breaks (total *** hours).    Active Treatment Strategies:  Fueling your brain is important for recovery. It is essential to stay well hydrated, aiming for half of your body weight in fluid ounces per day (100 lbs = 50 oz). We also recommend eating breakfast to start your day and focus on a well-balanced diet containing lean protein, 'good' fats, and complex carbohydrates. See your nutrition / hydration handout for more details.   Quality sleep is vital in your concussion recovery. We encourage lots of sleep for the first 24-72 hours after injury but following this period it is important to regulate your sleep cycle. We encourage 8 hours of quality sleep per night. See your sleep handout for more details and strategies to quality sleep.  I  Treating your vestibular and visual dysfunction will decrease your recovery time and improve your symptoms. Begin your home vestibular exercise program as directed on your AVS.    Begin taking DHA supplement as directed.  .   Follow-up information:  Follow up appointment at [facility] .   Patient needs to arrive 30 minutes prior to appointment to complete the following tests: { :28378}.    Patient Education:  Reviewed with patient the risks (i.e, a repeat concussion, post-concussion syndrome, second-impact syndrome) of  returning to play prior to complete resolution, and thoroughly reviewed the signs and symptoms of concussion.Reviewed need for complete resolution of all symptoms, with rest AND exertion, prior to return to play.  Reviewed red flags for urgent medical evaluation: worsening symptoms, nausea/vomiting, intractable headache, musculoskeletal changes, focal neurological deficits.  [facility]'s  Concussion Care Plan, which clearly outlines the plans stated above, was given to patient.  I was personally involved with the physical evaluation of and am in agreement with the assessment and treatment plan for this patient.  Greater than 50% of this encounter was spent in direct consultation with the patient in evaluation, counseling, and coordination of care. Duration of encounter: { :28531} minutes.  After Visit Summary printed out and provided to patient as appropriate.

## 2018-10-03 ENCOUNTER — Other Ambulatory Visit: Payer: Self-pay

## 2018-10-03 MED ORDER — VITAMIN D (ERGOCALCIFEROL) 1.25 MG (50000 UNIT) PO CAPS: 50000.0000 [IU] | ORAL_CAPSULE | ORAL | 1 refills | Status: DC

## 2018-10-04 ENCOUNTER — Other Ambulatory Visit: Payer: Self-pay | Admitting: *Deleted

## 2018-10-04 MED ORDER — VITAMIN D (ERGOCALCIFEROL) 1.25 MG (50000 UNIT) PO CAPS: 50000.0000 [IU] | ORAL_CAPSULE | ORAL | 0 refills | Status: AC

## 2018-12-19 ENCOUNTER — Encounter (INDEPENDENT_AMBULATORY_CARE_PROVIDER_SITE_OTHER): Payer: Self-pay

## 2018-12-19 ENCOUNTER — Ambulatory Visit (INDEPENDENT_AMBULATORY_CARE_PROVIDER_SITE_OTHER): Payer: Commercial Managed Care - POS | Admitting: Family Medicine

## 2018-12-19 VITALS — BP 118/80 | HR 103 | Temp 98.8°F | Resp 16 | Ht 64.0 in | Wt 135.0 lb

## 2018-12-19 DIAGNOSIS — R399 Unspecified symptoms and signs involving the genitourinary system: Secondary | ICD-10-CM

## 2018-12-19 DIAGNOSIS — N898 Other specified noninflammatory disorders of vagina: Secondary | ICD-10-CM

## 2018-12-19 LAB — POCT URINALYSIS AUTOMATED (IAH)
Glucose, UA POCT: NEGATIVE
Ketones, UA POCT: NEGATIVE mg/dL
Nitrite, UA POCT: NEGATIVE
PH, UA POCT: 5.5 (ref 4.6–8)
Protein, UA POCT: NEGATIVE mg/dL
Specific Gravity, UA POCT: >=1.03 mg/dL (ref 1.001–1.035)
Urine Leukocytes POCT: NEGATIVE
Urobilinogen, UA POCT: 0.2 mg/dL

## 2018-12-19 LAB — POCT PREGNANCY TEST, URINE HCG: POCT Pregnancy HCG Test, UR: NEGATIVE

## 2018-12-19 LAB — POCT BACTERIAL VAGINOSIS: POCT BACTERIAL VAGINOSIS: NEGATIVE

## 2018-12-19 NOTE — Progress Notes (Addendum)
[facility]  PROGRESS NOTE     Patient: Christina [name]   Date of Service: 12/19/2018   MRN: [id number]       Christina [name] is a 23 y.o. female      HISTORY     Chief Complaint   Patient presents with    Urinary Tract Infection Symptoms     uti symptoms/ yeast infection. burning sensation,itching and frequent urination. it started 2 weeks ago. patient finished  sulfamethoxazole and  fluconazole 150mg   but symptoms are still there.        HPI    23 YO female is presenting with dysuria, urgency and frequency for 2 week, she developed these symptoms 2 weeks ago a week ago she was treated with diflucan and bactrim without any improvements. She is not sexually active and does not want to have STD testing. Has scant vaginal discharge. Denies abdominal pain, nausea, vomiting and flank pain. No pelvic pain. Denies any genital rash.     Review of Systems   Constitutional: Negative for activity change, chills and fever.   HENT: Negative for congestion and sore throat.    Eyes: Negative for discharge.   Respiratory: Negative for cough and shortness of breath.    Cardiovascular: Negative for chest pain.   Gastrointestinal: Negative for abdominal pain and vomiting.   Genitourinary: Positive for dysuria, frequency, urgency and vaginal discharge. Negative for flank pain and pelvic pain.   Musculoskeletal: Negative for back pain and neck pain.   Skin: Negative for rash.   Neurological: Negative for dizziness, weakness and headaches.   Hematological: Negative for adenopathy.   Psychiatric/Behavioral: Negative for behavioral problems.   All other systems reviewed and are negative.      History:  Past Medical History:   Diagnosis Date    Anxiety     Depression     Syncope        Past Surgical History:   Procedure Laterality Date    CHOLECYSTECTOMY      TONSILLECTOMY      2007       Family History   Problem Relation Age of Onset    No known problems Mother     No known problems Father        Social History     Tobacco Use     Smoking status: Never Smoker    Smokeless tobacco: Never Used   Substance Use Topics    Alcohol use: No    Drug use: No       History reviewed.        Current Outpatient Medications:     drospirenone-ethinyl estradiol (YAZ) 3-0.02 MG per tablet, Take 1 tablet by mouth daily, Disp: , Rfl:     omeprazole (PRILOSEC) 20 MG capsule, Take 20 mg by mouth daily., Disp: , Rfl:     ranitidine (ZANTAC) 75 MG tablet, Take 75 mg by mouth 2 (two) times daily., Disp: , Rfl:     No Known Allergies    Medications and Allergies reviewed.    PHYSICAL EXAM     Vitals:    12/19/18 1406   BP: 118/80   Pulse: (!) 103   Resp: 16   Temp: 98.8 F (37.1 C)   TempSrc: Tympanic   SpO2: 100%   Weight: 61.2 kg (135 lb)   Height: 1.626 m (5\' 4" )       Physical Exam   Constitutional: She is oriented to person, place, and time. She  appears well-developed and well-nourished. No distress.   HENT:   Head: Normocephalic and atraumatic.   Eyes: Conjunctivae are normal. No scleral icterus.   Pulmonary/Chest: Effort normal.   Abdominal: Soft. There is no abdominal tenderness. There is no guarding.   Neurological: She is alert and oriented to person, place, and time.   Skin: Skin is warm and dry.   Psychiatric: She has a normal mood and affect.         UCC COURSE       Results     Procedure Component Value Units Date/Time    Urine Pregnancy Test, [[id number]]  (Normal) Collected:  12/19/18 1440    Specimen:  Urine Updated:  12/19/18 1450     POCT QC Pass     POCT Pregnancy HCG Test, UR Negative     Comment: Negative Value is Normal in Healthy Males or Healthy non-pregnant Females    UA Clinitek (urine dipstick) [[id number]]  (Abnormal) Collected:  12/19/18 1427    Specimen:  Urine Updated:  12/19/18 1433     Color UA POCT Yellow     Clarity UA POCT Cloudy     Glucose, UA POCT Negative     Bilirubin, UA POCT Small     Ketones, UA POCT Negative mg/dL      Specific Gravity, UA POCT >=1.030 mg/dL      Blood, UA POCT  Small     PH, UA POCT 5.5      Protein, UA POCT Negative mg/dL      Urobilinogen, UA POCT 0.2 mg/dL      Nitrite, UA POCT Negative     Leukocytes, UA POCT Negative            No results found.      No orders of the defined types were placed in this encounter.        PROCEDURES     Procedures       ASSESSMENT     Encounter Diagnoses   Name Primary?    UTI symptoms Yes    Itching in the vaginal area           SSESSMENT    PLAN     1. Drink plenty of fluids  2. Urine culture is pending  3. Take your antibiotic  4. Follow up with your doctor in 2-3 days if not better    Discussed results and diagnosis with patient/family.  Reviewed warning signs for worsening condition, as well as, indications for follow-up with pmd and return to urgent care clinic.   Patient/family expressed understanding of instructions.    Orders Placed This Encounter   Procedures    Urine culture (IL/LC/QU)    Vaginal Yeast Culture (LC)    UA Clinitek (urine dipstick)    Urine Pregnancy Test,    BV         An After Visit Summary was printed and given to the patient.      Signed,  Loura Halt, MD    12-22-2018  Urine cx positive.  Rx for cefdinir sent to her pharmacy.  Demetrius Charity, MD

## 2018-12-19 NOTE — Patient Instructions (Signed)
Bladder Infection, Female (Adult)    Urine normally doesn't have any germs (bacteria) in it. But bacteria can get into the urinary tract from the skin around the rectum. Or they can travel in the blood from other parts of the body. Once they are in your urinary tract, they can cause infection in these areas:   · The urethra (urethritis)  · The bladder (cystitis)  · The kidneys (pyelonephritis)  The most common place for an infection is in the bladder. This is called a bladder infection. This is one of the most common infections in women. Most bladder infections are easily treated. They are not serious unless the infection spreads to the kidney.   The terms bladder infection, UTI, and cystitis are often used to describe the same thing. But they are not always the same. Cystitis is an inflammation of the bladder. The most common cause of cystitis is an infection.   Symptoms  The infection causes inflammation in the urethra and bladder. This causes many of the symptoms. The most common symptoms of a bladder infection are:   · Pain or burning when urinating  · Having to urinate more often than normal  · Urgent need to urinate  · Only a small amount of urine comes out  · Blood in urine  · Belly (abdominal) discomfort. This is often in the lower belly above the pubic bone.  · Cloudy urine  · Strong- or bad-smelling urine  · Unable to urinate (urinary retention)  · Unable to hold urine in (urinary incontinence)  · Fever  · Loss of appetite  · Confusion (in older adults)  Causes  Bladder infections are not contagious. You can't get one from someone else, from a toilet seat, or from sharing a bath.   The most common cause of bladder infections is bacteria from the bowels. The bacteria get onto the skin around the opening of the urethra. From there, they can get into the urine. Then they travel up to the bladder, causing inflammation and infection. This often happens because of:   · Wiping incorrectly after urinating.  Always wipe from front to back.  · Bowel incontinence  · Pregnancy  · Procedures such as having a catheter put in  · Older age  · Not emptying your bladder. This can give bacteria a chance to grow in your urine.  · Fluid loss (dehydration)  · Constipation  · Having sex  · Using a diaphragm for birth control   Treatment  Bladder infections are diagnosed by a urine test and urine culture. They are treated with antibiotics. They often clear up quickly without problems. Treatment helps prevent a more serious kidney infection.   Medicines  Medicines can help in the treatment of a bladder infection:  · Take antibiotics until they are used up, even if you feel better. It's important to finish them to make sure the infection has cleared.  · You can use acetaminophen or ibuprofen for pain, fever, or discomfort, unless another medicine was prescribed. If you have long-term (chronic) liver or kidney disease, talk with your healthcare provider before using these medicines. Also talk with your provider if you've ever had a stomach ulcer or GI (gastrointestinal) bleeding, or are taking blood-thinner medicines.  · If you are given phenazopydridine to reduce burning with urination, it will make your urine a bright orange color. This can stain clothing.  Care and prevention  These self-care steps can help prevent future infections:  · Drink plenty of fluids. This helps to prevent dehydration   and flush out your bladder. Do this unless you must restrict fluids for other health reasons, or your healthcare provider told you not to.  · Clean yourself correctly after going to the bathroom. Wipe from front to back after using the toilet. This helps prevent the spread of bacteria.  · Urinate more often. Don't try to hold urine in for a long time.  · Wear loose-fitting clothes and cotton underwear. Don't wear tight-fitting pants.  · Improve your diet and prevent constipation. Eat more fresh fruits and vegetables, and fiber. Eat less junk  foods and fatty foods.  · Don't have sex until your symptoms are gone.  · Don't have caffeine, alcohol, and spicy foods. These can irritate your bladder.  · Urinate right after you have sex to flush out your bladder.  · If you use birth control pills and have frequent bladder infections, discuss it with your healthcare provider.  Follow-up care  Call your healthcare provider if all symptoms are not gone after 3 days of treatment. This is especially important if you have repeat infections.   If a culture was done, you will be told if your treatment needs to be changed. If directed, you can call to find out the results.   If X-rays were done, you will be told if the results will affect your treatment.  Call 911  Call 911 if any of the following occur:   · Trouble breathing  · Hard to wake up or confusion  · Fainting (loss of consciousness)  · Fast heart rate  When to get medical advice  Call your healthcare provider right away if any of these occur:  · Fever of 100.4ºF (38.0ºC) or higher, or as directed by your healthcare provider  · Symptoms are not better after 3 days of treatment  · Back or belly pain that gets worse  · Repeated vomiting, or unable to keep medicine down  · Weakness or dizziness  · Vaginal discharge  · Pain, redness, or swelling in the outer vaginal area (labia)  [employer] last reviewed this educational content on 05/24/2015  © 2000-2020 [employer] [address], [place] [address]. All rights reserved. This information is not intended as a substitute for professional medical care. Always follow your healthcare professional's instructions.        Vaginal Infection: Yeast (Candidiasis)  Yeast infection occurs when yeast in the vagina increase and attacks the vaginal tissues. Yeast is a type of fungus. These infections are often caused by a type of yeast called Candida albicans. Other species of yeast can also cause infections. Factors that may make infection more likely include  recent antibiotic use, douching, or increased sex. Yeast infections are more common in women who have diabetes, or are obese or pregnant, or have a weak immune system.  Symptoms of yeast infection  · Clumpy or thin, white discharge, which may look like cottage cheese  · No odor or minimal odor  · Severe vaginal itching or burning  · Burning with urination  · Swelling, redness of vulva  · Pain during sex    Treating yeast infection  Yeast infection is treated with a vaginal antifungal cream. In some cases, antifungal pills are prescribed instead. During treatment:  · Finish all of your medicine, even if your symptoms go away.  · Apply the cream before going to bed. Lie flat after applying so that it doesn't drip out.  · Do not douche or use tampons.  · Don't rely on a diaphragm or condoms,   since the cream may weaken them.  · Avoid intercourse if advised by your healthcare provider.  Should I treat a yeast infection myself?  Discuss with your healthcare provider whether you should use over-the-counter medicines to treat a yeast infection. Self-treatment may depend on whether:  · You've had a yeast infection in the past.  · You're at risk for STDs.  Call your healthcare provider if symptoms do not go away or come back after treatment.  [employer] last reviewed this educational content on 10/22/2015  © 2000-2020 [employer] [address], [place] [address]. All rights reserved. This information is not intended as a substitute for professional medical care. Always follow your healthcare professional's instructions.

## 2018-12-21 LAB — URINE CULTURE

## 2018-12-22 MED ORDER — CEFDINIR 300 MG PO CAPS
300.0000 mg | ORAL_CAPSULE | Freq: Two times a day (BID) | ORAL | 0 refills | Status: AC
Start: 2018-12-22 — End: 2018-12-29

## 2018-12-22 NOTE — Addendum Note (Signed)
Addended byIdolina Primer, Keerstin Bjelland A. on: 12/22/2018 08:19 AM     Modules accepted: Orders

## 2018-12-28 LAB — YEAST ONLY, CULTURE

## 2019-05-15 ENCOUNTER — Other Ambulatory Visit: Payer: Self-pay

## 2019-05-15 DIAGNOSIS — Z20822 Contact with and (suspected) exposure to covid-19: Secondary | ICD-10-CM

## 2019-05-16 LAB — NOVEL CORONAVIRUS, NAA: SARS-CoV-2, NAA: NOT DETECTED

## 2019-07-12 ENCOUNTER — Other Ambulatory Visit: Payer: Self-pay

## 2019-07-12 DIAGNOSIS — Z20822 Contact with and (suspected) exposure to covid-19: Secondary | ICD-10-CM

## 2019-07-16 LAB — NOVEL CORONAVIRUS, NAA: SARS-CoV-2, NAA: NOT DETECTED

## 2019-12-15 IMAGING — CT CT CERVICAL SPINE W/O CM
5 of 10 series · 12 of 34 positions shown, 13 images · non-contrast
Comparison: None available.

CLINICAL DATA: Initial evaluation for acute trauma, headache.

EXAM:
CT HEAD WITHOUT CONTRAST
CT MAXILLOFACIAL WITHOUT CONTRAST
CT CERVICAL SPINE WITHOUT CONTRAST
TECHNIQUE: Multidetector CT imaging of the head, cervical spine, and
maxillofacial structures were performed using the standard protocol
without intravenous contrast. Multiplanar CT image reconstructions
of the cervical spine and maxillofacial structures were also
generated.

[Series 7: c spine soft · axial · 0.30mm/px · z∈[-178,-130]mm · 2 of 74 slices shown]
[im 25/74  soft-tissue]
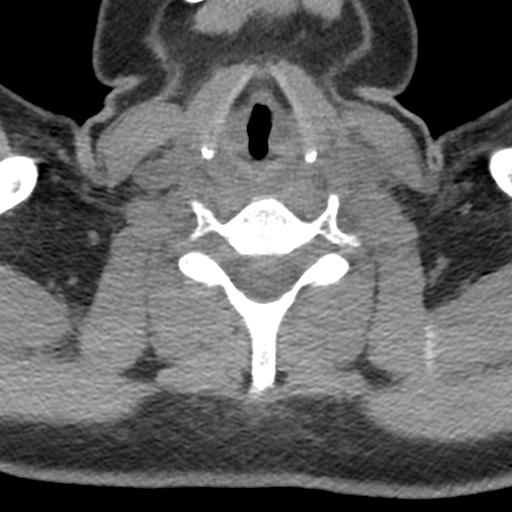
[im 49/74  soft-tissue]
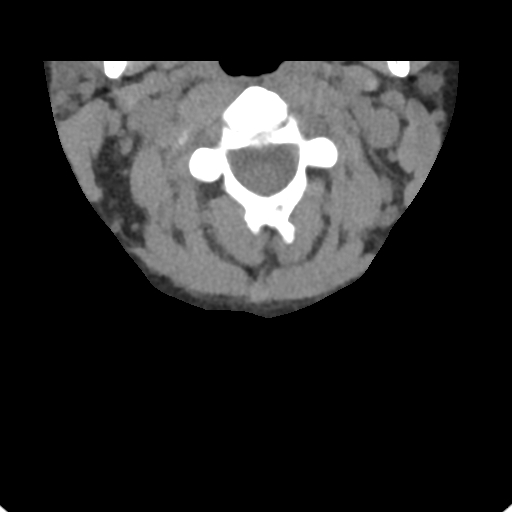

[Series 8: sagittal bone · sagittal · 0.21mm/px · 3 of 47 slices shown]
[im 12/47  bone]
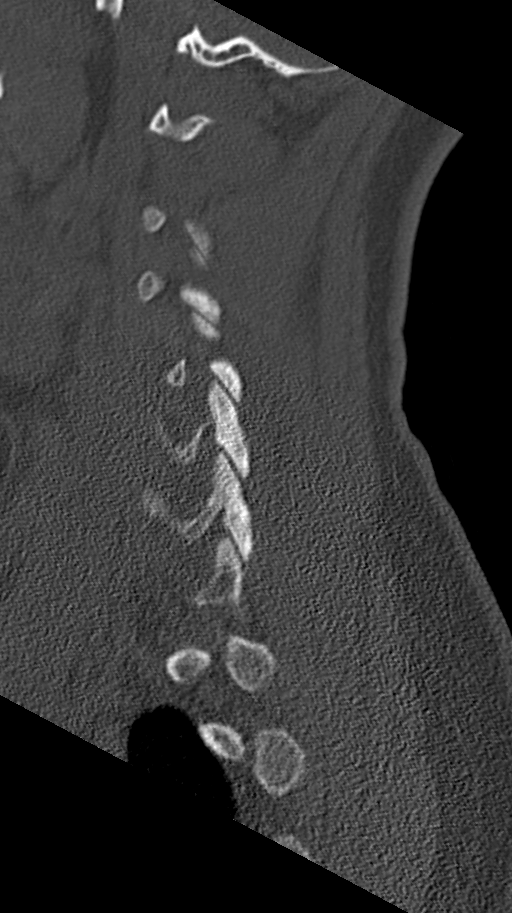
[im 24/47  bone]
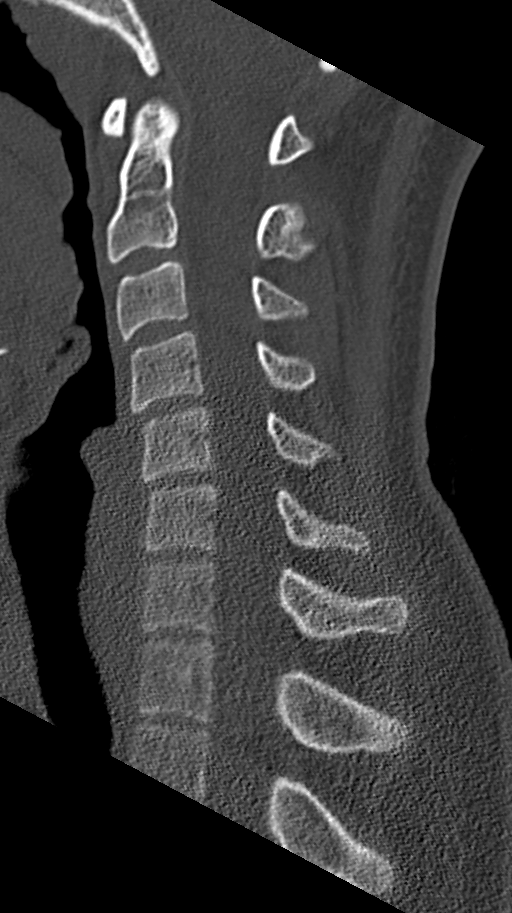
[im 35/47  bone]
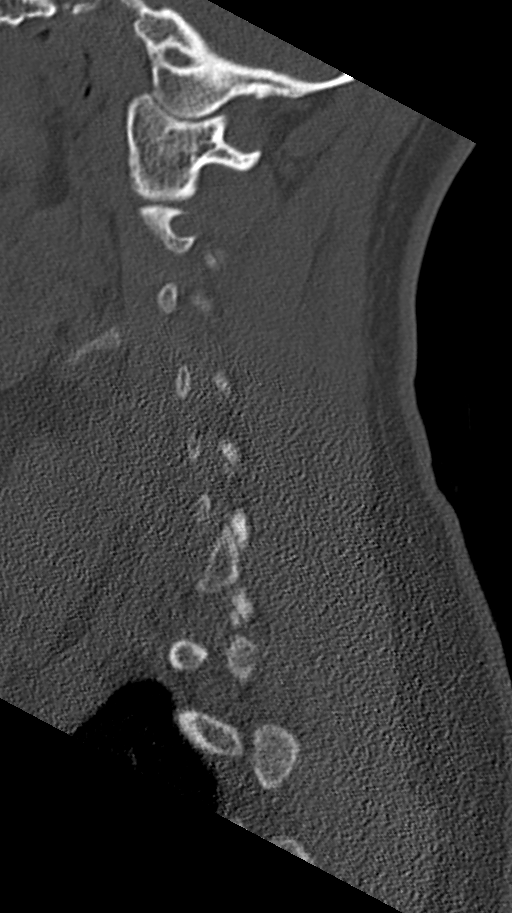

[Series 9: coronal bone · coronal · 0.18mm/px · 2 of 54 slices shown]
[im 18/54  bone]
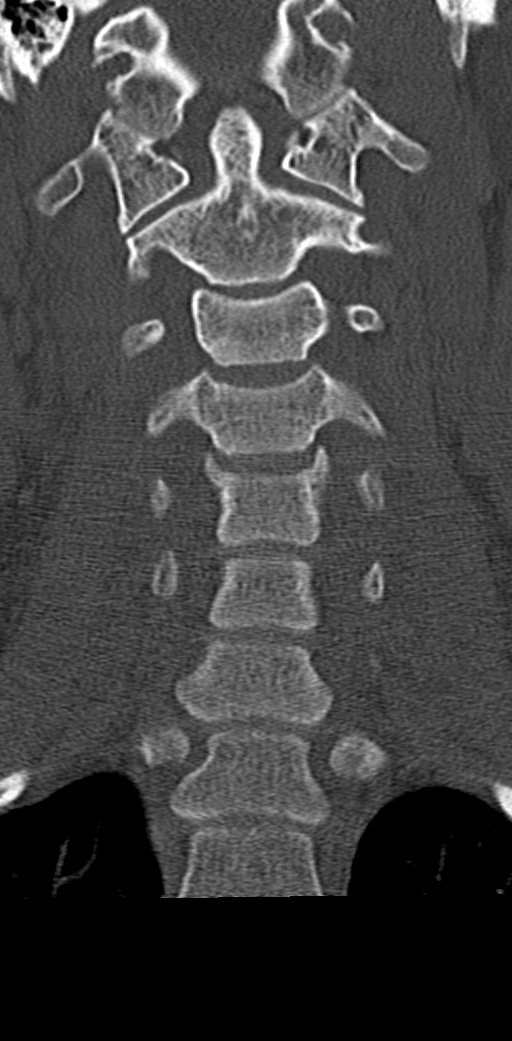
[im 36/54  bone]
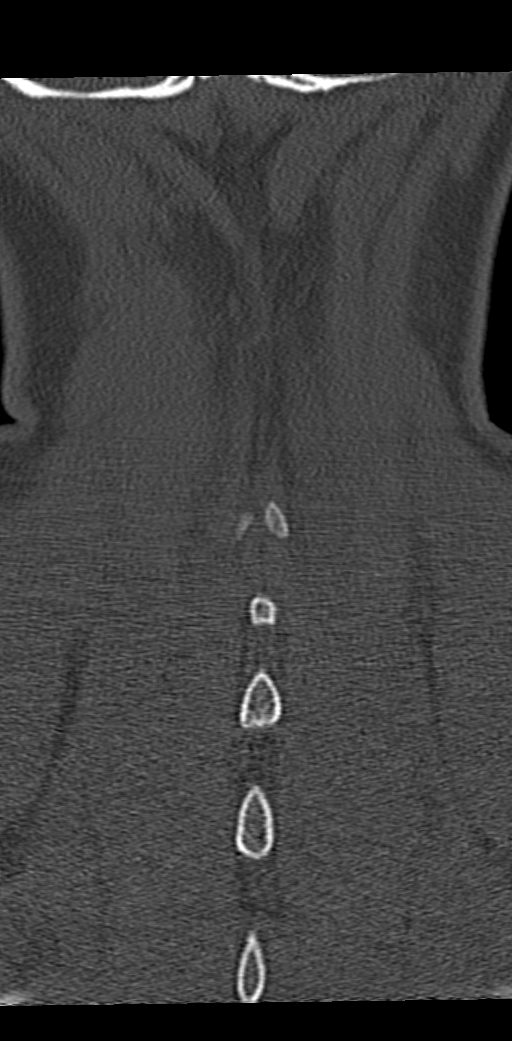

[Series 10: orthogonal axials · axial · 0.18mm/px · z∈[-225,-142]mm · 3 of 95 slices shown, 4 images]
[im 24/95  soft-tissue]
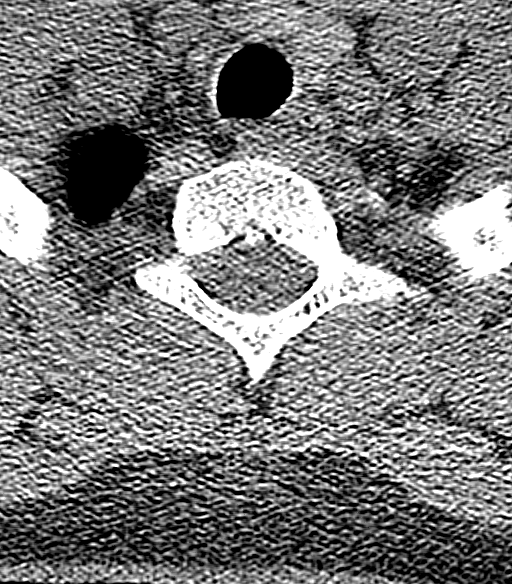
[im 24/95  bone]
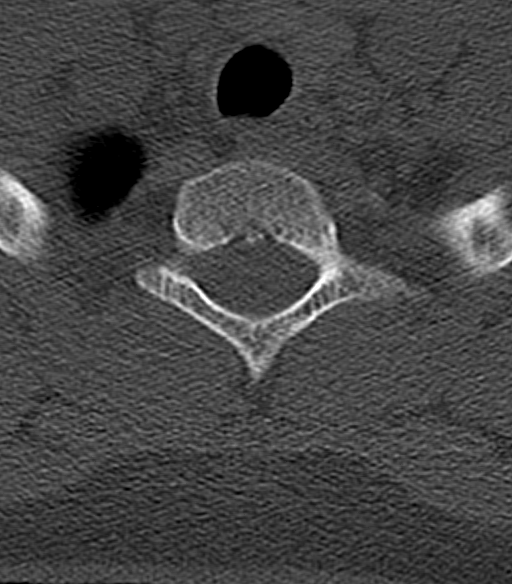
[im 48/95  bone]
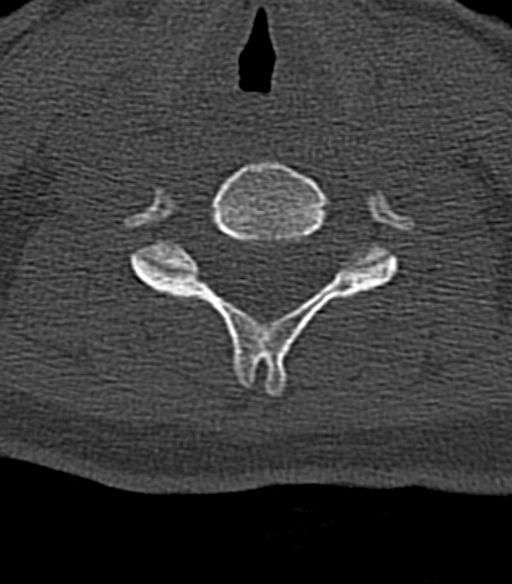
[im 71/95  bone]
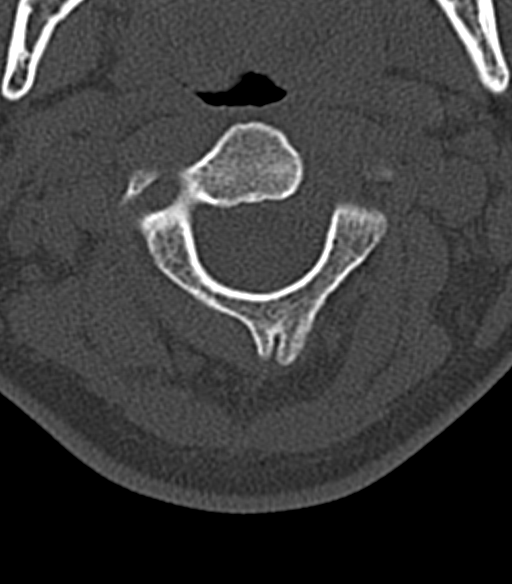

[Series 12: max soft · axial · 0.34mm/px · z∈[-160,-114]mm · 2 of 71 slices shown]
[im 24/71  soft-tissue]
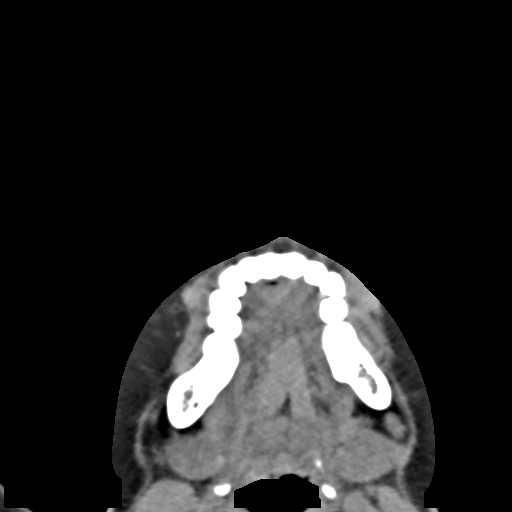
[im 47/71  soft-tissue]
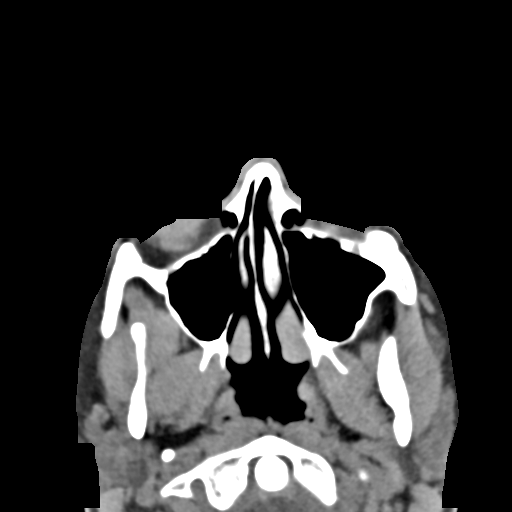

[12 of 34 positions shown; findings below may reference images not displayed]

FINDINGS: CT HEAD FINDINGS

Brain: Cerebral volume within normal limits for patient age.

No evidence for acute intracranial hemorrhage. No findings to
suggest acute large vessel territory infarct. No mass lesion,
midline shift, or mass effect. Ventricles are normal in size without
evidence for hydrocephalus. No extra-axial fluid collection
identified.

Vascular: No hyperdense vessel identified.

Skull: Scalp soft tissues demonstrate no acute abnormality.
Calvarium intact.

Sinuses/Orbits: Globes and orbital soft tissues within normal
limits.

Visualized paranasal sinuses are clear. No mastoid effusion.

CT MAXILLOFACIAL FINDINGS

Osseous: Zygomatic arches intact. No acute maxillary fracture.
Pterygoid plates intact. Acute minimally displaced left nasal bone
fracture (series 13, image 30). Nasal septum bowed to the right but
intact. Mandible intact. Mandibular condyles normally situated. No
acute abnormality about the dentition.

Orbits: Globes and orbital soft tissues within normal limits. Bony
orbits intact.

Sinuses: Paranasal sinuses are clear.

Soft tissues: Mild soft tissue irregularity/injury overlying the
nasal bone fracture. No other acute soft tissue abnormality.

CT CERVICAL SPINE FINDINGS

The vertebral bodies are normally aligned with preservation of the
normal cervical lordosis. Vertebral body heights are preserved.
Normal C1-2 articulations are intact. No prevertebral soft tissue
swelling. No acute fracture or listhesis.

Visualized soft tissues of the neck are within normal limits.
Visualized lung apices are clear without evidence of apical
pneumothorax.
IMPRESSION: 1. No acute intracranial process identified.
2. Acute minimally displaced left nasal bone fracture. No other
acute maxillofacial injury identified.
3. No acute traumatic injury within the cervical spine.

## 2020-01-08 ENCOUNTER — Encounter (INDEPENDENT_AMBULATORY_CARE_PROVIDER_SITE_OTHER): Payer: Self-pay | Admitting: Family Medicine

## 2020-01-10 ENCOUNTER — Encounter (INDEPENDENT_AMBULATORY_CARE_PROVIDER_SITE_OTHER): Payer: Self-pay | Admitting: Family Medicine

## 2020-01-10 ENCOUNTER — Ambulatory Visit (INDEPENDENT_AMBULATORY_CARE_PROVIDER_SITE_OTHER): Payer: Commercial Managed Care - POS | Admitting: Family Medicine

## 2020-01-10 VITALS — BP 102/60 | HR 84 | Temp 98.7°F | Resp 16 | Ht 64.0 in | Wt 128.2 lb

## 2020-01-10 DIAGNOSIS — N920 Excessive and frequent menstruation with regular cycle: Secondary | ICD-10-CM | POA: Insufficient documentation

## 2020-01-10 DIAGNOSIS — J0101 Acute recurrent maxillary sinusitis: Secondary | ICD-10-CM

## 2020-01-10 DIAGNOSIS — N898 Other specified noninflammatory disorders of vagina: Secondary | ICD-10-CM | POA: Insufficient documentation

## 2020-01-10 DIAGNOSIS — N926 Irregular menstruation, unspecified: Secondary | ICD-10-CM | POA: Insufficient documentation

## 2020-01-10 MED ORDER — PREDNISONE 20 MG PO TABS
20.0000 mg | ORAL_TABLET | Freq: Every day | ORAL | 0 refills | Status: DC
Start: 2020-01-10 — End: 2020-02-07

## 2020-01-10 NOTE — Progress Notes (Signed)
[facility]                       Date of Exam: 01/10/2020 9:04 AM        Patient ID: Christina [name] is a 24 y.o. female.  Attending Physician: Rivka Safer, MD        Chief Complaint:    Chief Complaint   Patient presents with    Cough               HPI:      Pt presents to f/u on bronchitis and UTI  [facility] and given cefdinir for SI.  However got worse, went back to [facility] DX with UTI and SI and given zpack  Feels sinus SX are worse  Currently feeling deep choppy cough, inflamed sinuses  Says has HX of SI, 6 months ago had sinus surgery which had helped until now            Problem List:    Patient Active Problem List   Diagnosis    Abdominal pain of multiple sites    Slow transit constipation    Rupture of ovarian cyst    Concussion without loss of consciousness, initial encounter    Irregular menstruation    Heavy menstrual period    Sinusitis    Vaginal itching             Current Meds:    Outpatient Medications Marked as Taking for the 01/10/20 encounter (Office Visit) with Rivka Safer, MD   Medication Sig Dispense Refill    ARIPiprazole (ABILIFY) 15 MG tablet Take 15 mg by mouth daily      busPIRone (BUSPAR) 15 MG tablet Take 1 tablet by mouth 2 (two) times daily      Lo Loestrin Fe 1 MG-10 MCG / 10 MCG Tab Take 1 tablet by mouth daily      omeprazole (PRILOSEC) 20 MG capsule Take 20 mg by mouth daily.      propranolol (INDERAL) 20 MG tablet Take 1 tablet by mouth daily as needed      ranitidine (ZANTAC) 75 MG tablet Take 75 mg by mouth 2 (two) times daily.      sertraline (ZOLOFT) 100 MG tablet Take 100 mg by mouth daily      sertraline (ZOLOFT) 50 MG tablet Take 1 tablet by mouth daily            Allergies:    No Known Allergies          Past Surgical History:    Past Surgical History:   Procedure Laterality Date    CHOLECYSTECTOMY      TONSILLECTOMY      2007           Family History:     Family History   Problem Relation Age of Onset    No known problems Mother     No known problems Father            Social History:    Social History     Tobacco Use    Smoking status: Never Smoker    Smokeless tobacco: Never Used   Substance Use Topics    Alcohol use: Yes     Comment: occasionally    Drug use: No          The following sections were reviewed this encounter by the provider:   Problems  Vital Signs:    BP 102/60 (BP Site: Right arm, Patient Position: Sitting, Cuff Size: Medium)    Pulse 84    Temp 98.7 F (37.1 C) (Temporal)    Resp 16    Ht 1.626 m (5\' 4" )    Wt 58.2 kg (128 lb 3.2 oz)    LMP 12/19/2019 (Approximate)    BMI 22.01 kg/m          ROS:    Review of Systems   General/Constitutional:   Denies Chills. Denies Fatigue. Denies Fever. Denies Weight gain. Denies Weight loss.   ENT:   Denies Hearing Loss. Denies Nasal Discharge. Denies Ear pain. Denies Nosebleed. Admits to Sinus pain. Denies Sore throat.   Endocrine:   Denies Polydipsia. Denies Polyuria.   Cardiovascular:   Denies Chest pain. Denies Chest pain with exertion. Denies Palpitations.   Respiratory:   Admits to Cough. Denies Shortness of breath. Denies Wheezing.   Gastrointestinal:   Denies Abdominal pain. Denies Diarrhea. Denies Nausea. Denies Vomiting.    Musculoskeletal:   Denies Joint pain. Denies Weakness in LE. Denies Swollen joints.   Skin:   Denies Itching. Denies Rash.   Neurologic:   Denies Balance difficulty. Denies Dizziness. Denies Headache.   Psychiatric:   Denies Anxiety. Denies Depressed mood.           Physical Exam:    Physical Exam   GENERAL APPEARANCE: alert, in no acute distress, well developed, well nourished   HEAD: normal appearance, atraumatic.   EYES: extraocular movement intact (EOMI).   NOSE: normal nasal mucosa  ORAL CAVITY: normal lips, mucosa moist, no lesions.    NECK/THYROID: neck supple  SKIN: no rashes  LUNGS: normal effort / no distress  ABDOMEN: nondistended.   MUSCULOSKELETAL:  full range of motion, no swelling or deformity.   EXTREMITIES: no edema  NEUROLOGIC: nonfocal, cranial nerves 2-12 grossly intact  PSYCH: cognitive function intact, mood/affect full range, speech clear.        Assessment:    1. Acute recurrent maxillary sinusitis  - predniSONE (DELTASONE) 20 MG tablet; Take 1 tablet (20 mg total) by mouth daily Take 3 tablets PO qd x 2 days, then 2 tablets PO qd x 2 days, then 1 tablet PO qd x 2 days  Dispense: 12 tablet; Refill: 0            Plan:    Rev UC note and labs including UA in chart  Given steroid script for now  Rev red flags, will call if any concerns/worsening/unresolved SX--would consider ABX at that point            Follow-up:    No follow-ups on file.         Rivka Safer, MD

## 2020-02-07 ENCOUNTER — Emergency Department: Payer: Commercial Managed Care - POS

## 2020-02-07 ENCOUNTER — Emergency Department
Admission: EM | Admit: 2020-02-07 | Discharge: 2020-02-07 | Disposition: A | Discharge status: Home / Self Care | Payer: Commercial Managed Care - POS | Attending: Emergency Medical Services | Admitting: Emergency Medical Services

## 2020-02-07 ENCOUNTER — Telehealth (INDEPENDENT_AMBULATORY_CARE_PROVIDER_SITE_OTHER): Payer: Self-pay

## 2020-02-07 ENCOUNTER — Encounter (INDEPENDENT_AMBULATORY_CARE_PROVIDER_SITE_OTHER): Payer: Self-pay | Admitting: Family Medicine

## 2020-02-07 ENCOUNTER — Ambulatory Visit (INDEPENDENT_AMBULATORY_CARE_PROVIDER_SITE_OTHER): Payer: Commercial Managed Care - POS | Admitting: Family Medicine

## 2020-02-07 VITALS — BP 96/64 | HR 93 | Temp 98.6°F | Resp 16 | Wt 132.4 lb

## 2020-02-07 DIAGNOSIS — R1084 Generalized abdominal pain: Secondary | ICD-10-CM | POA: Insufficient documentation

## 2020-02-07 DIAGNOSIS — Z9049 Acquired absence of other specified parts of digestive tract: Secondary | ICD-10-CM | POA: Insufficient documentation

## 2020-02-07 DIAGNOSIS — K5900 Constipation, unspecified: Secondary | ICD-10-CM

## 2020-02-07 LAB — CBC AND DIFFERENTIAL
Absolute NRBC: 0 10*3/uL (ref 0.00–0.00)
Basophils Absolute Automated: 0.05 10*3/uL (ref 0.00–0.08)
Basophils Automated: 1 %
Eosinophils Absolute Automated: 0.07 10*3/uL (ref 0.00–0.44)
Eosinophils Automated: 1.4 %
Hematocrit: 40.3 % (ref 34.7–43.7)
Hgb: 13.1 g/dL (ref 11.4–14.8)
Immature Granulocytes Absolute: 0.01 10*3/uL (ref 0.00–0.07)
Immature Granulocytes: 0.2 %
Lymphocytes Absolute Automated: 1.82 10*3/uL (ref 0.42–3.22)
Lymphocytes Automated: 35.3 %
MCH: 28.3 pg (ref 25.1–33.5)
MCHC: 32.5 g/dL (ref 31.5–35.8)
MCV: 87 fL (ref 78.0–96.0)
MPV: 10.5 fL (ref 8.9–12.5)
Monocytes Absolute Automated: 0.35 10*3/uL (ref 0.21–0.85)
Monocytes: 6.8 %
Neutrophils Absolute: 2.85 10*3/uL (ref 1.10–6.33)
Neutrophils: 55.3 %
Nucleated RBC: 0 /100 WBC (ref 0.0–0.0)
Platelets: 301 10*3/uL (ref 142–346)
RBC: 4.63 10*6/uL (ref 3.90–5.10)
RDW: 15 % (ref 11–15)
WBC: 5.15 10*3/uL (ref 3.10–9.50)

## 2020-02-07 LAB — COMPREHENSIVE METABOLIC PANEL
ALT: 23 U/L (ref 0–55)
AST (SGOT): 22 U/L (ref 5–34)
Albumin/Globulin Ratio: 1.4 (ref 0.9–2.2)
Albumin: 3.9 g/dL (ref 3.5–5.0)
Alkaline Phosphatase: 60 U/L (ref 37–106)
Anion Gap: 9 (ref 5.0–15.0)
BUN: 6 mg/dL — ABNORMAL LOW (ref 7–19)
Bilirubin, Total: 0.3 mg/dL (ref 0.2–1.2)
CO2: 21 mEq/L — ABNORMAL LOW (ref 22–29)
Calcium: 9.7 mg/dL (ref 8.5–10.5)
Chloride: 109 mEq/L (ref 100–111)
Creatinine: 0.8 mg/dL (ref 0.6–1.0)
Globulin: 2.8 g/dL (ref 2.0–3.6)
Glucose: 87 mg/dL (ref 70–100)
Potassium: 4.6 mEq/L (ref 3.5–5.1)
Protein, Total: 6.7 g/dL (ref 6.0–8.3)
Sodium: 139 mEq/L (ref 136–145)

## 2020-02-07 LAB — URINALYSIS REFLEX TO MICROSCOPIC EXAM - REFLEX TO CULTURE
Bilirubin, UA: NEGATIVE
Blood, UA: NEGATIVE
Glucose, UA: NEGATIVE
Ketones UA: NEGATIVE
Leukocyte Esterase, UA: NEGATIVE
Nitrite, UA: NEGATIVE
Protein, UR: NEGATIVE
Specific Gravity UA: 1.021 (ref 1.001–1.035)
Urine pH: 7 (ref 5.0–8.0)
Urobilinogen, UA: NORMAL mg/dL (ref 0.2–2.0)

## 2020-02-07 LAB — GFR: EGFR: >60

## 2020-02-07 LAB — LIPASE: Lipase: 51 U/L (ref 8–78)

## 2020-02-07 LAB — HCG, SERUM, QUALITATIVE: Hcg Qualitative: NEGATIVE

## 2020-02-07 MED ORDER — IOHEXOL 350 MG/ML IV SOLN
100.00 mL | Freq: Once | INTRAVENOUS | Status: AC | PRN
Start: 2020-02-07 — End: 2020-02-07
  Administered 2020-02-07: 16:00:00 100 mL via INTRAVENOUS

## 2020-02-07 MED ORDER — IOHEXOL 12 MG/ML PO SOLN
1000.00 mL | Freq: Once | ORAL | Status: AC
Start: 2020-02-07 — End: 2020-02-07
  Administered 2020-02-07: 15:00:00 1000 mL via ORAL

## 2020-02-07 MED ORDER — ACETAMINOPHEN 500 MG PO TABS
1000.00 mg | ORAL_TABLET | Freq: Once | ORAL | Status: AC
Start: 2020-02-07 — End: 2020-02-07
  Administered 2020-02-07: 15:00:00 1000 mg via ORAL
  Filled 2020-02-07: qty 2

## 2020-02-07 MED ORDER — ONDANSETRON HCL 4 MG/2ML IJ SOLN
4.00 mg | Freq: Once | INTRAMUSCULAR | Status: AC
Start: 2020-02-07 — End: 2020-02-07
  Administered 2020-02-07: 15:00:00 4 mg via INTRAVENOUS
  Filled 2020-02-07: qty 2

## 2020-02-07 MED ORDER — DOCUSATE SODIUM 100 MG PO CAPS
100.00 mg | ORAL_CAPSULE | Freq: Two times a day (BID) | ORAL | 0 refills | Status: AC
Start: 2020-02-07 — End: 2020-02-14

## 2020-02-07 MED ORDER — MORPHINE SULFATE 4 MG/ML IJ/IV SOLN (WRAP)
4.0000 mg | Freq: Once | Status: AC
Start: 2020-02-07 — End: 2020-02-07
  Administered 2020-02-07: 16:00:00 4 mg via INTRAVENOUS
  Filled 2020-02-07: qty 1

## 2020-02-07 MED ORDER — ONDANSETRON HCL 4 MG/2ML IJ SOLN
4.00 mg | Freq: Once | INTRAMUSCULAR | Status: AC
Start: 2020-02-07 — End: 2020-02-07
  Administered 2020-02-07: 16:00:00 4 mg via INTRAVENOUS
  Filled 2020-02-07: qty 2

## 2020-02-07 NOTE — ED Provider Notes (Signed)
[facility]       [place] [facility] EMERGENCY DEPARTMENT PATIENT ENCOUNTER  Patient Information   Patient Name: Christina [name], Christina [name]  Encounter Date:  02/07/2020  Patient DOB:  [birth year]  MRN:  [id number]  Room:  10/A10  Rendering Provider: Suzi Roots. Willim Turnage, PA-C  History of Presenting Illness   Chief Complaint: abdominal pain    HPI Comments:   24 y.o. female with hx of constipation presents to the ED for evaluation of abdominal pain.  The patient says that at the end of May, she returned home from college.  She says that she frequently has problems with constipation but usually resolve with MiraLAX and if that does not work she takes Colace with relief.  She says that she does not feel like she has had a good bowel movement since returning home from college.  States that her diet has not changed significantly.  She says that for the past 1 week she has been trying medications at home for constipation without any relief.  She also had small bowel movement 3 days ago but did not change her pain.  For the past 2 days has had nausea and burning pain in the upper part of her abdomen.  She says that she had somewhat similar pain to this in 2014 and ended up having her gallbladder removed.  She says that this pain feels somewhat similar.  She saw her primary care doctor prior to arrival and was told to come to the ED for CT scan.  She has not had any vomiting.  No diarrhea.  No urinary symptoms.  She is not sexually active and has no concern for sexually transmitted infection.    The patient says she has not seen a GI doctor since her surgery in 2014  PMD: Rivka Safer, MD  Past Medical History     Past Medical History:   Diagnosis Date    Anxiety     Depression     Syncope      Home Medications   No current facility-administered medications for this encounter.    Current Outpatient Medications:     ARIPiprazole (ABILIFY) 15 MG tablet, Take 15 mg by mouth daily, Disp: , Rfl:     busPIRone  (BUSPAR) 15 MG tablet, Take 1 tablet by mouth 2 (two) times daily, Disp: , Rfl:     docusate sodium (COLACE) 100 MG capsule, Take 1 capsule (100 mg total) by mouth 2 (two) times daily for 7 days, Disp: 14 capsule, Rfl: 0    propranolol (INDERAL) 20 MG tablet, Take 1 tablet by mouth daily as needed, Disp: , Rfl:     sertraline (ZOLOFT) 100 MG tablet, Take 100 mg by mouth daily, Disp: , Rfl:     sertraline (ZOLOFT) 50 MG tablet, Take 1 tablet by mouth daily, Disp: , Rfl:   Past Surgical History     Past Surgical History:   Procedure Laterality Date    CHOLECYSTECTOMY  2013    TONSILLECTOMY      2007     Family History     Family History   Problem Relation Age of Onset    No known problems Mother     No known problems Father      Social History   Patient is a non-smoker  Review of Systems   Constitutional: Negative for fever or chills.   HEENT: Negative for sore throat, runny nose  Cardiovascular: Negative for chest pain  Respiratory: Negative  for cough and shortness of breath.   Gastrointestinal: Positive for abd. pain, nausea, constipation. Negative vomiting, diarrhea.  Genitourinary: Negative for dysuria, hematuria.   Musculoskeletal: Negative for back pain   Skin: Negative for rash.   Neuro: Negative for dizziness, headache   Physical Exam   PHYSICAL EXAM  Patient Vitals for the past 24 hrs:   BP Temp Temp src Pulse Resp SpO2 Weight   02/07/20 1734 110/60 98 F (36.7 C) Oral 68 18 99 % --   02/07/20 1602 104/72 98.2 F (36.8 C) Oral 86 18 98 % --   02/07/20 1257 -- -- -- -- -- -- 59.8 kg   02/07/20 1256 105/72 98.6 F (37 C) Oral 92 18 96 % --     Constitutional: Well-developed and well-nourished. No distress.  Non-toxic appearing  HENT:     Head: Normocephalic and atraumatic.     Right/Left Ear: External ears normal.     Mouth/Throat: Moist mucous membranes. Uvula is midline. Oropharynx is clear.  Eyes: Conjunctivae normal. Pupils are equal, round, and reactive to light.   Neck: Normal range of motion.  Supple.  Cardiovascular: Regular rate and rhythm. No murmur.    Pulmonary/Chest: Vesicular breath sounds throughout. No respiratory distress. O2 sat 96% on RA.   Abdominal: Bowel sounds are present throughout. Abd is tender in LUQ and epigastrium without guarding or rebound. No CVAT bilaterally  Neurological: Pt is alert and oriented to person, place, and time. GCS 15. Normal speech.  Skin: Skin is warm and dry. Visualized skin without rash.  Psychiatric: Calm and cooperative with normal behavior  Orders Placed During This Encounter     Orders Placed This Encounter   Procedures    CT Abd/Pelvis with IV and PO Contrast    CBC and differential    Comprehensive metabolic panel    Lipase    Urinalysis Reflex to Microscopic Exam- Reflex to Culture    Beta HCG, Qual, Serum    GFR    Saline lock IV     ED Medications Administered     ED Medication Orders (From admission, onward)    Start Ordered     Status Ordering Provider    02/07/20 1625 02/07/20 1625  iohexol (OMNIPAQUE) 350 MG/ML injection 100 mL  IMG once as needed     Route: Intravenous  Ordered Dose: 100 mL     Last MAR action: Imaging Agent Given SUTINGCO, ALEXANDER-NICHOLAS D    02/07/20 1549 02/07/20 1548  morphine injection 4 mg  Once     Route: Intravenous  Ordered Dose: 4 mg     Last MAR action: Given Dhaval Woo C    02/07/20 1549 02/07/20 1548  ondansetron (ZOFRAN) injection 4 mg  Once     Route: Intravenous  Ordered Dose: 4 mg     Last MAR action: Given Sherea Liptak C    02/07/20 1422 02/07/20 1421  iohexol (OMNIPAQUE) 12 mg/mL oral solution (premix) 1,000 mL  Once     Route: Oral  Ordered Dose: 1,000 mL     Last MAR action: Imaging Agent Given Savannaha Stonerock C    02/07/20 1422 02/07/20 1421  ondansetron (ZOFRAN) injection 4 mg  Once     Route: Intravenous  Ordered Dose: 4 mg     Last MAR action: Given Markian Glockner C    02/07/20 1422 02/07/20 1421  acetaminophen (TYLENOL) tablet 1,000 mg  Once     Route: Oral  Ordered Dose: 1,000 mg  Last MAR  action: Given Zykeriah Mathia C        Diagnostic Study Results and Data Review   The results of the diagnostic studies below were reviewed by the ED provider:  Labs  Results     Procedure Component Value Units Date/Time    Urinalysis Reflex to Microscopic Exam- Reflex to Culture [[id number]] Collected: 02/07/20 1500     Updated: 02/07/20 1511     Urine Type Urine, Clean Ca     Color, UA Yellow     Clarity, UA Clear     Specific Gravity UA 1.021     Urine pH 7.0     Leukocyte Esterase, UA Negative     Nitrite, UA Negative     Protein, UR Negative     Glucose, UA Negative     Ketones UA Negative     Urobilinogen, UA Normal mg/dL      Bilirubin, UA Negative     Blood, UA Negative    Lipase [[id number]] Collected: 02/07/20 1359    Specimen: Blood Updated: 02/07/20 1425     Lipase 51 U/L     GFR [[id number]] Collected: 02/07/20 1359     Updated: 02/07/20 1425     EGFR >60.0    Comprehensive metabolic panel [[id number]]  (Abnormal) Collected: 02/07/20 1359    Specimen: Blood Updated: 02/07/20 1425     Glucose 87 mg/dL      BUN 6 mg/dL      Creatinine 0.8 mg/dL      Sodium 629 mEq/L      Potassium 4.6 mEq/L      Chloride 109 mEq/L      CO2 21 mEq/L      Calcium 9.7 mg/dL      Protein, Total 6.7 g/dL      Albumin 3.9 g/dL      AST (SGOT) 22 U/L      ALT 23 U/L      Alkaline Phosphatase 60 U/L      Bilirubin, Total 0.3 mg/dL      Globulin 2.8 g/dL      Albumin/Globulin Ratio 1.4     Anion Gap 9.0    Beta HCG, Qual, Serum [[id number]] Collected: 02/07/20 1359    Specimen: Blood Updated: 02/07/20 1421     Hcg Qualitative Negative    CBC and differential [[id number]] Collected: 02/07/20 1359    Specimen: Blood Updated: 02/07/20 1409     WBC 5.15 x10 3/uL      Hgb 13.1 g/dL      Hematocrit 64.4 %      Platelets 301 x10 3/uL      RBC 4.63 x10 6/uL      MCV 87.0 fL      MCH 28.3 pg      MCHC 32.5 g/dL      RDW 15 %      MPV 10.5 fL      Neutrophils 55.3 %      Lymphocytes Automated 35.3 %      Monocytes 6.8 %      Eosinophils Automated  1.4 %      Basophils Automated 1.0 %      Immature Granulocytes 0.2 %      Nucleated RBC 0.0 /100 WBC      Neutrophils Absolute 2.85 x10 3/uL      Lymphocytes Absolute Automated 1.82 x10 3/uL      Monocytes Absolute Automated 0.35 x10 3/uL      Eosinophils Absolute Automated  0.07 x10 3/uL      Basophils Absolute Automated 0.05 x10 3/uL      Immature Granulocytes Absolute 0.01 x10 3/uL      Absolute NRBC 0.00 x10 3/uL           Radiologic Studies  Radiology Results (24 Hour)     Procedure Component Value Units Date/Time    CT Abd/Pelvis with IV and PO Contrast [[id number]] Collected: 02/07/20 1642    Order Status: Completed Updated: 02/07/20 1648    Narrative:      Clinical history: Left-sided abdominal pain.    TECHNIQUE: CT of the abdomen and pelvis was performed with IV and oral  contrast. 100 cc of Omnipaque 350 was administered intravenously. The  following dose reduction techniques were utilized: automated exposure  control and/or adjustment of the mA and/or kV according to patient  size, and the use of iterative reconstruction technique.    COMPARISON: CT dated 03/10/2014.    FINDINGS:    The patient is status post cholecystectomy. There is mild biliary  prominence. The liver, pancreas, spleen, adrenals, and kidneys are  within normal limits.    The bowel is unobstructed. There is moderate amount of stool in the  colon. The bladder and uterus appear unremarkable. There is no  adenopathy or ascites.    Bone windows show no destructive bony lesions. There is minimal basilar  dependent atelectasis.      Impression:          1. Status post cholecystectomy with mild biliary prominence.  2. Moderate amount of stool in the colon compatible with clinical  diagnosis of constipation.    Georgiana Spinner, MD   02/07/2020 4:46 PM        Abnormal results/incidental findings discussed with pt and/or family: Yes  Monitors, EKG, Procedures, Critical Care, and Splints   None  MDM, Consults and Clinical Notes   The following differentials  were considered in part and not exclusively: Constipation, UTI, pyelonephritis, ovarian cyst, irritable bowel, diverticulitis, appendicitis    Nursing records reviewed: Yes    Clinical Notes/MDM: 24 y.o. female to the ED for evaluation of abdominal pain.  Patient says that she has chronic problems with constipation but usually resolved with p.o. medications but no avail this time.  She does have left-sided tenderness on exam but no guarding or rebound.  Story and exam consistent with appendicitis but will do CT to exclude pathology given her previous surgical history.  Patient would like Zofran and Tylenol for pain.      PPE option 1 was utilized.  PPE options:  1. surgical mask, gloves  2. N95 mask, surgical mask, face shield/goggles, gown, gloves  3. [id number] [id number] Respirator, surgical cap, face shield/goggles, gown, gloves     Re-Eval at the following times:     1600  Patient's labs and UA normal  Negative hCG  She has finished CT prep and waiting to be called for imaging  Patient signed out to Nettie Elm pending imaging results    COVID Notes  The patient was seen and evaluated during the time of COVID pandemic.  Significant limitations can be present in the evaluation and management of ED patients during pandemic conditions, including but not limited to lack of testing, rapidly changing [facility] protocols, limited evaluation and follow-up resources.     Prescriptions     Discharge Medication List as of 02/07/2020  5:04 PM      START taking these medications    Details  docusate sodium (COLACE) 100 MG capsule Take 1 capsule (100 mg total) by mouth 2 (two) times daily for 7 days, Starting Thu 02/07/2020, Until Thu 02/14/2020, [facility]           Diagnosis and Disposition   Clinical Impression:  1. Constipation, unspecified constipation type    2. Generalized abdominal pain      Final diagnoses:   Constipation, unspecified constipation type   Generalized abdominal pain       Disposition:  ED Disposition     ED Disposition Condition  Date/Time Comment    Discharge  Thu Feb 07, 2020  5:04 PM Jones Skene discharge to home/self care.    Condition at disposition: Stable        Rendering Provider: Suzi Roots. Micaila Ziemba, PA-C  Attending's signature signifies review of the provider note and clinical impression.  This note was generated by the [facility] EMR system/Dragon speech recognition and may contain inherent errors or omissions not intended by the user. Grammatical errors, random word insertions, deletions, pronoun errors and incomplete sentences are occasional consequences of this technology due to software limitations. Not all errors are caught or corrected. If there are questions or concerns about the content of this note or information contained within the body of this dictation they should be addressed directly with the author for clarification.       Jeanne Ivan, [place]  02/07/20 2036       S[place]co, Alexander-Nicholas D, MD  02/12/20 1036

## 2020-02-07 NOTE — Telephone Encounter (Signed)
Pt called and complains of severe constipation x 2 weeks.   Pt has tried suppository, dulcolax, miralax.   Over the last couple of days pt has developed severe stomach pain, stomach burning sensation. Pt reports hx of galbladder removal when younger.     Pain is differently now. Has upper R and L quadrants.     OTC: Tylenol, pepcid.   Denies fever.   Has nausea.   Pt has been schedule for apt this morning.

## 2020-02-07 NOTE — Progress Notes (Signed)
[facility]                       Date of Exam: 02/07/2020 12:36 PM        Patient ID: Christina [name] is a 24 y.o. female.  Attending Physician: Adrian Blackwater Shanai Lartigue, MD        Chief Complaint:    Chief Complaint   Patient presents with    Abdominal Pain     Fully vaccinated fr COVID.               HPI:    Abdominal pain started 2 weeks ago, worsened last night.  H/O constipation.  Has burning sensation on the left side, feels dizzy.  No change in diet.  Miralax didn't work as it usually does, either did dulcolax, no relief with suppository.  Doesn't feel similar to constipation.  Last BM was 2 days ago which is not unusual.  No dysuria or bleeding, discharge.  Denies being sexually active.  Worse with eating.  N w/o V.  Feels similar to her GB attack.    Abdominal Pain  Pertinent negatives include no dysuria or frequency.             Problem List:    Patient Active Problem List   Diagnosis    Abdominal pain of multiple sites    Slow transit constipation    Rupture of ovarian cyst    Concussion without loss of consciousness, initial encounter    Irregular menstruation    Heavy menstrual period    Sinusitis    Vaginal itching             Current Meds:    Outpatient Medications Marked as Taking for the 02/07/20 encounter (Office Visit) with Javoris Star, Adrian Blackwater, MD   Medication Sig Dispense Refill    ARIPiprazole (ABILIFY) 15 MG tablet Take 15 mg by mouth daily      busPIRone (BUSPAR) 15 MG tablet Take 1 tablet by mouth 2 (two) times daily      propranolol (INDERAL) 20 MG tablet Take 1 tablet by mouth daily as needed      sertraline (ZOLOFT) 100 MG tablet Take 100 mg by mouth daily      sertraline (ZOLOFT) 50 MG tablet Take 1 tablet by mouth daily            Allergies:    No Known Allergies          Past Surgical History:    Past Surgical History:   Procedure Laterality Date    CHOLECYSTECTOMY  2013    TONSILLECTOMY      2007           Family History:    Family History   Problem  Relation Age of Onset    No known problems Mother     No known problems Father            Social History:    Social History     Tobacco Use    Smoking status: Never Smoker    Smokeless tobacco: Never Used   Haematologist Use: Never used   Substance Use Topics    Alcohol use: Yes     Comment: occasionally    Drug use: No          The following sections were reviewed this encounter by the provider:   Tobacco   Allergies   Meds  Problems   Med Hx   Surg Hx   Fam Hx              Vital Signs:    BP 96/64 (BP Site: Right arm, Patient Position: Sitting, Cuff Size: Medium)    Pulse 93    Temp 98.6 F (37 C) (Temporal)    Resp 16    Wt 60.1 kg (132 lb 6.4 oz)    LMP  (LMP Unknown)    BMI 22.73 kg/m          ROS:    Review of Systems   Gastrointestinal: Positive for abdominal pain.   Genitourinary: Negative for dysuria, frequency and vaginal discharge.        Constitutional: no fever, no significant weight loss  GI: with nausea, no vomiting, no diarrhea, with constipation, no hematemesis, no heartburn, no difficulty swallowing, no bright red blood per rectum, no melena        Physical Exam:    Physical Exam     Constitutional: General appearance: well developed and well nourished. Appears uncomfortable lying on exam table  Ears no lesions  Neck: supple  Lungs/Chest: respiratory effort unlabored.  Auscultation: no wheezing, rales/crackles, or rhonchi and breath sounds are normal  Cardiovascular: Rate and rhythm: regular.  Heart sounds: no murmurs, rubs, or gallops.  No clubbing, cyanosis,  or edema  Abdomen: Bowel sounds: normal.  Inspection and palpation: generalized tenderness, worse near umbilicus, no guarding or rebound tenderness.  Soft and non-distended.    Extremities: no erythema, swelling and pulses full.        Assessment:    1. Generalized abdominal pain            Plan:    Didn't feel she could wait for wu through office so went to ER            Follow-up:    No follow-ups on file.         Adrian Blackwater  Parneet Glantz, MD

## 2020-02-07 NOTE — ED Provider Notes (Signed)
ED Signout     Patient received from: Julious Payer [facility] at 1600    Signout notes: Pending       Diagnostic Study Results     The results of the diagnostic studies below were reviewed by the ED provider:    Labs  Results     Procedure Component Value Units Date/Time    Urinalysis Reflex to Microscopic Exam- Reflex to Culture [[id number]] Collected: 02/07/20 1500     Updated: 02/07/20 1511     Urine Type Urine, Clean Ca     Color, UA Yellow     Clarity, UA Clear     Specific Gravity UA 1.021     Urine pH 7.0     Leukocyte Esterase, UA Negative     Nitrite, UA Negative     Protein, UR Negative     Glucose, UA Negative     Ketones UA Negative     Urobilinogen, UA Normal mg/dL      Bilirubin, UA Negative     Blood, UA Negative    Lipase [[id number]] Collected: 02/07/20 1359    Specimen: Blood Updated: 02/07/20 1425     Lipase 51 U/L     GFR [[id number]] Collected: 02/07/20 1359     Updated: 02/07/20 1425     EGFR >60.0    Comprehensive metabolic panel [[id number]]  (Abnormal) Collected: 02/07/20 1359    Specimen: Blood Updated: 02/07/20 1425     Glucose 87 mg/dL      BUN 6 mg/dL      Creatinine 0.8 mg/dL      Sodium 664 mEq/L      Potassium 4.6 mEq/L      Chloride 109 mEq/L      CO2 21 mEq/L      Calcium 9.7 mg/dL      Protein, Total 6.7 g/dL      Albumin 3.9 g/dL      AST (SGOT) 22 U/L      ALT 23 U/L      Alkaline Phosphatase 60 U/L      Bilirubin, Total 0.3 mg/dL      Globulin 2.8 g/dL      Albumin/Globulin Ratio 1.4     Anion Gap 9.0    Beta HCG, Qual, Serum [[id number]] Collected: 02/07/20 1359    Specimen: Blood Updated: 02/07/20 1421     Hcg Qualitative Negative    CBC and differential [[id number]] Collected: 02/07/20 1359    Specimen: Blood Updated: 02/07/20 1409     WBC 5.15 x10 3/uL      Hgb 13.1 g/dL      Hematocrit 32.9 %      Platelets 301 x10 3/uL      RBC 4.63 x10 6/uL      MCV 87.0 fL      MCH 28.3 pg      MCHC 32.5 g/dL      RDW 15 %      MPV 10.5 fL      Neutrophils 55.3 %      Lymphocytes Automated 35.3 %       Monocytes 6.8 %      Eosinophils Automated 1.4 %      Basophils Automated 1.0 %      Immature Granulocytes 0.2 %      Nucleated RBC 0.0 /100 WBC      Neutrophils Absolute 2.85 x10 3/uL      Lymphocytes Absolute Automated 1.82 x10 3/uL      Monocytes Absolute  Automated 0.35 x10 3/uL      Eosinophils Absolute Automated 0.07 x10 3/uL      Basophils Absolute Automated 0.05 x10 3/uL      Immature Granulocytes Absolute 0.01 x10 3/uL      Absolute NRBC 0.00 x10 3/uL           Radiologic Studies  Radiology Results (24 Hour)     Procedure Component Value Units Date/Time    CT Abd/Pelvis with IV and PO Contrast [[id number]] Collected: 02/07/20 1642    Order Status: Completed Updated: 02/07/20 1648    Narrative:      Clinical history: Left-sided abdominal pain.    TECHNIQUE: CT of the abdomen and pelvis was performed with IV and oral  contrast. 100 cc of Omnipaque 350 was administered intravenously. The  following dose reduction techniques were utilized: automated exposure  control and/or adjustment of the mA and/or kV according to patient  size, and the use of iterative reconstruction technique.    COMPARISON: CT dated 03/10/2014.    FINDINGS:    The patient is status post cholecystectomy. There is mild biliary  prominence. The liver, pancreas, spleen, adrenals, and kidneys are  within normal limits.    The bowel is unobstructed. There is moderate amount of stool in the  colon. The bladder and uterus appear unremarkable. There is no  adenopathy or ascites.    Bone windows show no destructive bony lesions. There is minimal basilar  dependent atelectasis.      Impression:          1. Status post cholecystectomy with mild biliary prominence.  2. Moderate amount of stool in the colon compatible with clinical  diagnosis of constipation.    Georgiana Spinner, MD   02/07/2020 4:46 PM          Vital Signs  BP 104/72    Pulse 86    Temp 98.2 F (36.8 C) (Oral)    Resp 18    Wt 59.8 kg    LMP  (LMP Unknown)    SpO2 98%    BMI 22.63 kg/m    Patient Vitals for the past 24 hrs:   BP Temp Temp src Pulse Resp SpO2 Weight   02/07/20 1602 104/72 98.2 F (36.8 C) Oral 86 18 98 % --   02/07/20 1257 -- -- -- -- -- -- 59.8 kg   02/07/20 1256 105/72 98.6 F (37 C) Oral 92 18 96 % --           Clinical Course / MDM     Notes:   3-4d hx of LUQ abd pain w gastric burning. Many days of no BM. Previous gallbladder surgery.   Labs all wnl. Appears well. In NAD. UA normal.   Pending CT scan  Patient appears in no acute distress.  CT shows moderate amount of stool, no other concerns.  Noted status post cholecystectomy  On bedside exam patient appears in no acute distress.  Advised on stool softeners plenty of fluids activities monitor follow-up outpatient with family doctor and return for any other concerns worsening      Clinical Impression:  1. Constipation, unspecified constipation type    2. Generalized abdominal pain        ED Disposition     ED Disposition Condition Date/Time Comment    Discharge  Thu Feb 07, 2020  5:04 PM Jones Skene discharge to home/self care.    Condition at disposition: Stable  Nettie Elm, [place]  02/07/20 1704       [place], Alexander-Nicholas D, MD  02/07/20 [phone]

## 2020-02-07 NOTE — ED Triage Notes (Signed)
Pt to the ed with upper left abd pain, denies urinary complaints. Denies n/v/d

## 2020-02-07 NOTE — Discharge Instructions (Signed)
Monitor symptoms for any worsening. Followup with your family doctor as discussed. Take any medications prescribed to you as directed. Return for any other concerns or for worsening. Please read all patient education sheets.     Monitor symptoms, stay active, take medications as we discussed, drink plenty fluids.  Follow-up with GI as needed for any other concerns as well as her family doctor.  Return for any concerns worsening          You were seen today by Nettie Elm, PA-C. Thank you for choosing the [facility] for your healthcare needs. We hope your visit today was EXCELLENT.    Follow up with your doctor tomorrow if any symptoms persists.   Please take any medications prescribed as directed.   If you have any questions or concerns, I am available at [phone]. Please do not hesitate to contact me if I can be of assistance.   Below is some information and resources that our patients often find helpful.   Sincerely,   Leodis Sias Department of Emergency Medicine   ________________________________________________________________   Thank you for choosing [facility] for your emergency care needs. We strive to provide EXCELLENT care to you and your family.   IF YOU DO NOT CONTINUE TO IMPROVE OR YOUR CONDITION WORSENS, PLEASE CONTACT YOUR DOCTOR OR RETURN IMMEDIATELY TO THE EMERGENCY DEPARTMENT.   DOCTOR REFERRALS   Call [phone] (available 24 hours a day, 7 days a week) if you need any further referrals and we can help you find a primary care doctor or specialist. Also, available online at: [email or web address]   YOUR CONTACT INFORMATION   Before leaving please check with registration to make sure we have an up-to-date contact number. You can call registration at [phone] to update your information. For questions about your hospital bill, please call [phone]. For questions about your Emergency Dept Physician bill please call  [phone].   FREE HEALTH SERVICES   If you need help with health or social services, please call 2-1-1 for a free referral to resources in your area. 2-1-1 is a free service connecting people with information on health insurance, free clinics, pregnancy, mental health, dental care, food assistance, housing, and substance abuse counseling. Also, available online at: http://www.[facility].org   MEDICAL RECORDS AND TESTS   Certain laboratory test results do not come back the same day, for example urine cultures. We will contact you if other important findings are noted. Radiology films are often reviewed again to ensure accuracy. If there is any discrepancy, we will notify you.   Please call [phone] to pick up a complimentary CD of any radiology studies performed. If you or your doctor would like to request a copy of your medical records, please call [phone].   ORTHOPEDIC INJURY   Please know that significant injuries can exist even when an initial x-ray is read as normal or negative. This can occur because some fractures (broken bones) are not initially visible on x-rays. For this reason, close outpatient follow-up with your primary care doctor or bone specialist (orthopedist) is required.   MEDICATIONS AND FOLLOWUP   Please be aware that some prescription medications can cause drowsiness. Use caution when driving or operating machinery.   The examination and treatment you have received in our Emergency Department is provided on an emergency basis, and is not intended to be a substitute for your primary care physician. It is important that your doctor  checks you again and that you report any new or remaining problems at that time.   [place], [place], [place] [address] (1.4 miles, 7 minutes)   [place], [place], [place] [address] (6.5 miles, 13 minutes)   Handout with directions available on request.

## 2020-02-07 NOTE — Progress Notes (Signed)
Patient with c/o constipation x 2 weeks.  With upper abdominal pain radiating to the left side x 1.5 weeks ago.   States has tenderness on the upper abdomen.  Nauseous but no vomiting.  Denies fever.  Thinks she is not drinking adequate fluids.  Eats vegetables.  Tried Miralax and Dulcolax suppository.  Last BM 2 days ago.  Stays active.

## 2020-02-19 ENCOUNTER — Ambulatory Visit (INDEPENDENT_AMBULATORY_CARE_PROVIDER_SITE_OTHER): Payer: Commercial Managed Care - POS | Admitting: Family Medicine

## 2020-02-19 ENCOUNTER — Encounter (INDEPENDENT_AMBULATORY_CARE_PROVIDER_SITE_OTHER): Payer: Self-pay | Admitting: Family Medicine

## 2020-02-19 VITALS — BP 90/60 | HR 92 | Temp 97.6°F | Resp 18 | Ht 64.0 in | Wt 130.0 lb

## 2020-02-19 DIAGNOSIS — R1084 Generalized abdominal pain: Secondary | ICD-10-CM

## 2020-02-19 DIAGNOSIS — K5901 Slow transit constipation: Secondary | ICD-10-CM

## 2020-02-19 MED ORDER — NAPROXEN 500 MG PO TABS
500.00 mg | ORAL_TABLET | Freq: Two times a day (BID) | ORAL | 0 refills | Status: AC
Start: 2020-02-19 — End: 2020-03-20

## 2020-02-19 NOTE — Progress Notes (Signed)
Pt presents for to f/u on ER visit.  Was at [facility] for abdominal pain on 02/07/20.  Was prescribed colace, pain has worsened since.  Pain associated with nausea, fatigue, and dizziness.

## 2020-02-19 NOTE — Progress Notes (Signed)
[facility] [place]                       Date of Exam: 02/19/2020 6:43 PM        Patient ID: Christina [name] is a 24 y.o. female.  Attending Physician: Adrian Blackwater Madyx Delfin, MD        Chief Complaint:    Chief Complaint   Patient presents with    Abdominal Pain               HPI:    Abdominal pain, burning, started over 3 weeks ago L > R.  Went to ER 6/17, CT showed moderate amount of stool c/w constipation.  Reports pain persists despite use of suppository, miralax, enema. Worse with eating.  N w/o V.   Abdomen is swollen.  Dizzy.  Having a hard time getting out of bed.  Had BM this am like pellets.  Has to strain to have BM.  9/10, worse after eating.  Has GI visit in 8 days.    Abdominal Pain  This is a recurrent problem. The current episode started 1 to 4 weeks ago. The problem occurs constantly. The problem has been gradually worsening. The pain is located in the LLQ. The pain is severe. The quality of the pain is burning. The abdominal pain radiates to the RLQ. Associated symptoms include constipation. Pertinent negatives include no anorexia, diarrhea, dysuria, fever, frequency, hematochezia or melena. The pain is aggravated by eating. The pain is relieved by nothing. The treatment provided no relief. Prior diagnostic workup includes lower endoscopy.             Problem List:    Patient Active Problem List   Diagnosis    Abdominal pain of multiple sites    Slow transit constipation    Rupture of ovarian cyst    Concussion without loss of consciousness, initial encounter    Irregular menstruation    Heavy menstrual period    Sinusitis    Vaginal itching             Current Meds:    Outpatient Medications Marked as Taking for the 02/19/20 encounter (Office Visit) with Victorino Fatzinger, Adrian Blackwater, MD   Medication Sig Dispense Refill    ARIPiprazole (ABILIFY) 15 MG tablet Take 15 mg by mouth daily      busPIRone (BUSPAR) 15 MG tablet Take 1 tablet by mouth 2 (two) times daily       levonorgestrel (Mirena, 52 MG,) 20 MCG/24HR IUD Place vaginally once      propranolol (INDERAL) 20 MG tablet Take 1 tablet by mouth daily as needed      sertraline (ZOLOFT) 100 MG tablet Take 100 mg by mouth daily      sertraline (ZOLOFT) 50 MG tablet Take 1 tablet by mouth daily            Allergies:    No Known Allergies          Past Surgical History:    Past Surgical History:   Procedure Laterality Date    CHOLECYSTECTOMY  2013    TONSILLECTOMY      2007           Family History:    Family History   Problem Relation Age of Onset    No known problems Mother     No known problems Father            Social History:  Social History     Tobacco Use    Smoking status: Never Smoker    Smokeless tobacco: Never Used   Vaping Use    Vaping Use: Never used   Substance Use Topics    Alcohol use: Yes     Comment: occasionally    Drug use: No          The following sections were reviewed this encounter by the provider:   Tobacco   Allergies   Meds   Problems   Med Hx   Surg Hx   Fam Hx              Vital Signs:    BP 90/60 (BP Site: Right arm, Patient Position: Sitting, Cuff Size: Medium)    Pulse 92    Temp 97.6 F (36.4 C) (Temporal)    Resp 18    Ht 1.626 m (5\' 4" )    Wt 59 kg (130 lb)    LMP  (LMP Unknown)    BMI 22.31 kg/m          ROS:    Review of Systems   Constitutional: Negative for fever.   Gastrointestinal: Positive for abdominal pain and constipation. Negative for anorexia, diarrhea, hematochezia and melena.   Genitourinary: Negative for dysuria, frequency and vaginal discharge.        Constitutional: no fever, no significant weight loss  GI: with nausea, no vomiting, no diarrhea, with constipation, no hematemesis, no heartburn, no difficulty swallowing, no bright red blood per rectum, no melena        Physical Exam:    Physical Exam     Constitutional: General appearance: well developed and well nourished. Appears uncomfortable lying on exam table  Ears no lesions  Neck: supple  Lungs/Chest:  respiratory effort unlabored.  Auscultation: no wheezing, rales/crackles, or rhonchi and breath sounds are normal  Cardiovascular: Rate and rhythm: regular.  Heart sounds: no murmurs, rubs, or gallops.  No clubbing, cyanosis,  or edema  Abdomen: Bowel sounds: normal.  Inspection and palpation: tenderness LLQ, mild tenderness palpation RLQ, no guarding or rebound tenderness.  Soft and non-distended.    Extremities: no erythema, swelling and pulses full.  MSK: no hip tenderness, FROM        Assessment:    1. Generalized abdominal pain  - naproxen (NAPROSYN) 500 MG tablet; Take 1 tablet (500 mg total) by mouth 2 (two) times daily with meals  Dispense: 60 tablet; Refill: 0    2. Slow transit constipation            Plan:    Proceed with GI w/u as planned  Push fiber, miralax, stool softener          Follow-up:    After GI eval         Mikaia Janvier L Zyion Leidner, MD

## 2020-02-29 ENCOUNTER — Other Ambulatory Visit: Payer: Self-pay | Admitting: Gastroenterology

## 2020-02-29 DIAGNOSIS — R109 Unspecified abdominal pain: Secondary | ICD-10-CM

## 2020-03-15 ENCOUNTER — Other Ambulatory Visit (INDEPENDENT_AMBULATORY_CARE_PROVIDER_SITE_OTHER): Payer: Self-pay | Admitting: Family Medicine

## 2020-03-15 DIAGNOSIS — R1084 Generalized abdominal pain: Secondary | ICD-10-CM

## 2020-03-16 NOTE — Telephone Encounter (Signed)
How is she doing?  Did she request this?

## 2020-03-17 NOTE — Telephone Encounter (Signed)
LMTCB on home/cell #.

## 2020-03-18 NOTE — Telephone Encounter (Signed)
Pt said she did not request for refill.

## 2020-03-26 ENCOUNTER — Ambulatory Visit: Payer: Commercial Managed Care - POS

## 2020-04-09 ENCOUNTER — Encounter (INDEPENDENT_AMBULATORY_CARE_PROVIDER_SITE_OTHER): Payer: Self-pay | Admitting: Family Medicine

## 2020-04-09 ENCOUNTER — Ambulatory Visit (INDEPENDENT_AMBULATORY_CARE_PROVIDER_SITE_OTHER): Payer: Commercial Managed Care - POS | Admitting: Family Medicine

## 2020-04-09 VITALS — BP 98/62 | HR 68 | Temp 97.9°F | Resp 16 | Ht 64.0 in | Wt 130.0 lb

## 2020-04-09 DIAGNOSIS — J0101 Acute recurrent maxillary sinusitis: Secondary | ICD-10-CM

## 2020-04-09 MED ORDER — AMOXICILLIN-POT CLAVULANATE 875-125 MG PO TABS: 1.0000 | ORAL_TABLET | Freq: Two times a day (BID) | ORAL | 0 refills | Status: AC

## 2020-04-09 NOTE — Progress Notes (Signed)
[facility]                       Date of Exam: 04/09/2020 4:16 PM        Patient ID: Christina [name] is a 24 y.o. female.  Attending Physician: Ria Comment, MD        Chief Complaint:    Chief Complaint   Patient presents with    Sinus Problem     sinus pressure, bilateral ear pressure, stuffy nose, ST, PND, and body aches x 3-4 days               HPI:    4 days of symptoms.  Initially with nasal congestion and equal pressure in the cheeks on both sides.  She now has PND.  She has not note any fevers.  She has had issues with her sinuses in the past, she recalls a sinus infection in May -- had antibiotics with this procedure.  She had sinus surgery in December 2020.  She is not Flonase chronically but started last night.  She denies any pain radiating into the upper teeth or jaw.  She has also tried some MUcinex and Ibuprofen.              Problem Listthi    Patient Active Problem List   Diagnosis    Abdominal pain of multiple sites    Slow transit constipation    Rupture of ovarian cyst    Concussion without loss of consciousness, initial encounter    Irregular menstruation    Heavy menstrual period    Sinusitis    Vaginal itching             Current Meds:    Outpatient Medications Marked as Taking for the 04/09/20 encounter (Office Visit) with Ria Comment, MD   Medication Sig Dispense Refill    ARIPiprazole (ABILIFY) 15 MG tablet Take 15 mg by mouth daily      busPIRone (BUSPAR) 15 MG tablet Take 1 tablet by mouth 2 (two) times daily      levonorgestrel (Mirena, 52 MG,) 20 MCG/24HR IUD Place vaginally once      propranolol (INDERAL) 20 MG tablet Take 1 tablet by mouth daily as needed      sertraline (ZOLOFT) 100 MG tablet Take 100 mg by mouth daily      sertraline (ZOLOFT) 50 MG tablet Take 1 tablet by mouth daily            Allergies:    No Known Allergies          Past Surgical History:    Past Surgical History:   Procedure  Laterality Date    CHOLECYSTECTOMY  2013    TONSILLECTOMY      2007           Family History:    Family History   Problem Relation Age of Onset    No known problems Mother     No known problems Father            Social History:    Social History     Tobacco Use    Smoking status: Never Smoker    Smokeless tobacco: Never Used   Haematologist Use: Never used   Substance Use Topics    Alcohol use: Yes     Comment: occasionally    Drug use: No  The following sections were reviewed this encounter by the provider:            Vital Signs:    BP 98/62 (BP Site: Right arm, Patient Position: Sitting, Cuff Size: Medium)    Pulse 68    Temp 97.9 F (36.6 C) (Temporal)    Resp 16    Ht 1.626 m (5\' 4" )    Wt 59 kg (130 lb)    BMI 22.31 kg/m          ROS:    Review of Systems   Constitutional: Negative for chills and fever.   HENT: Positive for congestion, postnasal drip, rhinorrhea, sinus pressure and sinus pain. Negative for sore throat.    Respiratory: Negative for cough, chest tightness and wheezing.    Cardiovascular: Negative for chest pain.   Gastrointestinal: Negative.               Physical Exam:    Physical Exam  Constitutional:       General: She is not in acute distress.  HENT:      Head: Normocephalic and atraumatic.      Right Ear: Tympanic membrane and ear canal normal.      Left Ear: Tympanic membrane and ear canal normal.      Nose: Congestion and rhinorrhea present.      Mouth/Throat:      Mouth: Mucous membranes are moist.      Pharynx: No posterior oropharyngeal erythema.   Eyes:      Conjunctiva/sclera: Conjunctivae normal.   Cardiovascular:      Rate and Rhythm: Normal rate and regular rhythm.      Pulses: Normal pulses.      Heart sounds: Normal heart sounds.   Pulmonary:      Effort: Pulmonary effort is normal.   Musculoskeletal:      Cervical back: Normal range of motion.   Neurological:      Mental Status: She is alert.              Assessment:    1. Acute recurrent maxillary  sinusitis  - amoxicillin-clavulanate (AUGMENTIN) 875-125 MG per tablet; Take 1 tablet by mouth 2 (two) times daily for 10 days  Dispense: 20 tablet; Refill: 0            Plan:      - continue Mucinex, flonase, Tylenol as needed  - add saline rinses  - ABX if not improving  - follow up if not resolved with 10 days of ABX  - follow up sooner for new or worsening symptoms.           Follow-up:    No follow-ups on file.         Ria Comment, MD

## 2020-08-14 ENCOUNTER — Encounter (INDEPENDENT_AMBULATORY_CARE_PROVIDER_SITE_OTHER): Payer: Self-pay | Admitting: Family Medicine

## 2020-08-14 ENCOUNTER — Telehealth (INDEPENDENT_AMBULATORY_CARE_PROVIDER_SITE_OTHER): Payer: Commercial Managed Care - POS | Admitting: Family Medicine

## 2020-08-14 DIAGNOSIS — Z20828 Contact with and (suspected) exposure to other viral communicable diseases: Secondary | ICD-10-CM

## 2020-08-14 DIAGNOSIS — J0141 Acute recurrent pansinusitis: Secondary | ICD-10-CM

## 2020-08-14 DIAGNOSIS — J029 Acute pharyngitis, unspecified: Secondary | ICD-10-CM

## 2020-08-14 MED ORDER — CEFUROXIME AXETIL 500 MG PO TABS
500.00 mg | ORAL_TABLET | Freq: Two times a day (BID) | ORAL | 0 refills | Status: AC
Start: 2020-08-14 — End: 2020-08-24

## 2020-08-14 NOTE — Progress Notes (Signed)
[facility]   [facility]                       Date of Virtual Visit: 08/14/2020 6:12 PM        Patient ID: Christina [name] is a 24 y.o. female.  Attending Physician: Beverely Pace, MD       Telemedicine Eligibility:    State Location:  [x]    []  [place]  []  [place] []  [place]  []  Other:    Physical Location:  [x]  Home  []         []        []          []  Other:    Patient Identity Verification:  []  State Issued ID  []  Insurance Eligibility Check  [x]  Other:    Physical Address Verification: (for 911)  [x]  Yes  []  No    Personal identity shared with patient:  [x]  Yes  []  No    Education on nature of video visit shared with patient:  [x]  Yes  []  No    Emergency plan agreed upon with patient:  [x]  Yes  []  No    If the patient had not had this virtual visit, what would they have done?  []         []         []        []          []  Other:    Visit terminated since not appropriate for virtual care:  [x]  N/A  []  Reason:    Verbal consent has been obtained from the patient to conduct and bill insurance for this video visit encounter to minimize exposure to COVID-19.  Patient was positively identified and location verified at the time of visit.         Chief Complaint:    Chief Complaint   Patient presents with    Sinusitis               HPI:    Video visit today with complaint of possible sinus infection.    Brother is positive for COVID and lives in the same house -- tested positive 4 days ago.    Has had some congestion for a couple weeks.  Feels worse the last couple days.  Feeling a lot of pressure in  Maxillary and frontal sinuses and ears.  PND.  Denies fever.    History of sinus surgery.    She has tried Mucinex and a prescribed Neti pot which have provided mild relief.               Problem List:    Patient Active Problem List   Diagnosis    Abdominal pain of multiple sites    Slow transit constipation    Rupture of ovarian cyst     Concussion without loss of consciousness, initial encounter    Irregular menstruation    Heavy menstrual period    Sinusitis    Vaginal itching             Current Meds:    Outpatient Medications Marked as Taking for the 08/14/20 encounter (Telemedicine Visit) with Beverely Pace, MD   Medication Sig Dispense Refill    busPIRone (BUSPAR) 15 MG tablet Take 1 tablet by mouth 2 (two) times daily      levonorgestrel (Mirena, 52 MG,) 20 MCG/24HR IUD Place vaginally once  propranolol (INDERAL) 20 MG tablet Take 1 tablet by mouth daily as needed      sertraline (ZOLOFT) 100 MG tablet Take 100 mg by mouth daily      sertraline (ZOLOFT) 50 MG tablet Take 1 tablet by mouth daily            Allergies:    No Known Allergies          Past Surgical History:    Past Surgical History:   Procedure Laterality Date    CHOLECYSTECTOMY  2013    TONSILLECTOMY      2007           Family History:    Family History   Problem Relation Age of Onset    No known problems Mother     No known problems Father            Social History:    Social History     Tobacco Use    Smoking status: Never Smoker    Smokeless tobacco: Never Used   Haematologist Use: Never used   Substance Use Topics    Alcohol use: Yes     Comment: occasionally    Drug use: No          The following sections were reviewed this encounter by the provider:   Tobacco   Allergies   Meds   Problems   Med Hx   Surg Hx   Fam Hx              Vital Signs:    There were no vitals taken for this visit.         ROS:    Review of Systems           Physical Exam:    Physical Exam  Constitutional:       General: She is not in acute distress.     Appearance: She is ill-appearing. She is not toxic-appearing.   HENT:      Head: Normocephalic.      Nose: Congestion present.   Eyes:      General: No scleral icterus.        Right eye: No discharge.         Left eye: No discharge.      Conjunctiva/sclera: Conjunctivae normal.   Pulmonary:      Effort: Pulmonary effort  is normal. No respiratory distress.      Comments: No cough  No audible wheezing  Musculoskeletal:      Cervical back: Neck supple.   Neurological:      General: No focal deficit present.      Mental Status: She is alert and oriented to person, place, and time.      Cranial Nerves: No cranial nerve deficit.   Psychiatric:         Mood and Affect: Mood normal.               Assessment:    1. Acute recurrent pansinusitis  - cefuroxime (CEFTIN) 500 MG tablet; Take 1 tablet (500 mg total) by mouth 2 (two) times daily for 10 days  Dispense: 20 tablet; Refill: 0    2. Sore throat  - Coronavirus COVID-19    3. Exposure to SARS-associated coronavirus  - Coronavirus COVID-19            Plan:      Will treat for sinusitis but also test for COVID given + COVID  in her house.          Follow-up:    No follow-ups on file.         Beverely Pace, MD

## 2020-08-17 LAB — CORONAVIRUS, COVID-19: SARS-CoV-2: NOT DETECTED

## 2020-08-18 ENCOUNTER — Telehealth (INDEPENDENT_AMBULATORY_CARE_PROVIDER_SITE_OTHER): Payer: Self-pay | Admitting: Family Medicine

## 2020-08-18 NOTE — Telephone Encounter (Signed)
Pt called requesting for a call to discuss her recent covid results. Pt is working on resting her password for her Christina [name] so she can get inside her account but she is asking for a call. Pt confirmed the CB# [phone]. -esmith [email or web address]

## 2020-08-18 NOTE — Telephone Encounter (Addendum)
Covid test is negative. Will contact patient in the morning.

## 2020-08-19 NOTE — Telephone Encounter (Signed)
LMTCB on cell #. BBLevins LPN

## 2020-11-23 ENCOUNTER — Encounter: Payer: Self-pay | Admitting: Intensive Care

## 2020-11-23 ENCOUNTER — Emergency Department
Admission: EM | Admit: 2020-11-23 | Discharge: 2020-11-23 | Disposition: A | Discharge status: Home / Self Care | Payer: Managed Care, Other (non HMO) | Attending: Emergency Medicine | Admitting: Emergency Medicine

## 2020-11-23 ENCOUNTER — Emergency Department: Payer: Managed Care, Other (non HMO)

## 2020-11-23 ENCOUNTER — Other Ambulatory Visit: Payer: Self-pay

## 2020-11-23 DIAGNOSIS — U071 COVID-19: Secondary | ICD-10-CM | POA: Diagnosis not present

## 2020-11-23 DIAGNOSIS — M940 Chondrocostal junction syndrome [Tietze]: Secondary | ICD-10-CM

## 2020-11-23 DIAGNOSIS — R0789 Other chest pain: Secondary | ICD-10-CM | POA: Diagnosis present

## 2020-11-23 HISTORY — DX: Depression, unspecified: F32.A

## 2020-11-23 HISTORY — DX: Deviated nasal septum: J34.2

## 2020-11-23 LAB — CBC
HCT: 44.8 % (ref 36.0–46.0)
Hemoglobin: 14.6 g/dL (ref 12.0–15.0)
MCH: 29.4 pg (ref 26.0–34.0)
MCHC: 32.6 g/dL (ref 30.0–36.0)
MCV: 90.3 fL (ref 80.0–100.0)
Platelets: 311 10*3/uL (ref 150–400)
RBC: 4.96 MIL/uL (ref 3.87–5.11)
RDW: 13.1 % (ref 11.5–15.5)
WBC: 8.1 10*3/uL (ref 4.0–10.5)
nRBC: 0 % (ref 0.0–0.2)

## 2020-11-23 LAB — BASIC METABOLIC PANEL
Anion gap: 8 (ref 5–15)
BUN: 11 mg/dL (ref 6–20)
CO2: 23 mmol/L (ref 22–32)
Calcium: 9.2 mg/dL (ref 8.9–10.3)
Chloride: 106 mmol/L (ref 98–111)
Creatinine, Ser: 0.59 mg/dL (ref 0.44–1.00)
GFR, Estimated: >60 mL/min (ref >60)
Glucose, Bld: 107 mg/dL — ABNORMAL HIGH (ref 70–99)
Potassium: 4.2 mmol/L (ref 3.5–5.1)
Sodium: 137 mmol/L (ref 135–145)

## 2020-11-23 LAB — POC URINE PREG, ED: Preg Test, Ur: NEGATIVE

## 2020-11-23 LAB — TROPONIN I (HIGH SENSITIVITY)
Troponin I (High Sensitivity): <2 ng/L (ref <18)
Troponin I (High Sensitivity): 3 ng/L (ref <18)

## 2020-11-23 LAB — HEPATIC FUNCTION PANEL
ALT: 20 U/L (ref 0–44)
AST: 24 U/L (ref 15–41)
Albumin: 4 g/dL (ref 3.5–5.0)
Alkaline Phosphatase: 62 U/L (ref 38–126)
Bilirubin, Direct: 0.1 mg/dL (ref 0.0–0.2)
Indirect Bilirubin: 0.7 mg/dL (ref 0.3–0.9)
Total Bilirubin: 0.8 mg/dL (ref 0.3–1.2)
Total Protein: 6.6 g/dL (ref 6.5–8.1)

## 2020-11-23 MED ORDER — IOHEXOL 350 MG/ML SOLN
75.0000 mL | Freq: Once | INTRAVENOUS | Status: AC | PRN
  Administered 2020-11-23: 75 mL via INTRAVENOUS

## 2020-11-23 MED ORDER — BENZONATATE 100 MG PO CAPS: 100.0000 mg | ORAL_CAPSULE | Freq: Four times a day (QID) | ORAL | 0 refills | Status: AC | PRN

## 2020-11-23 MED ORDER — MELOXICAM 15 MG PO TABS: 15.0000 mg | ORAL_TABLET | Freq: Every day | ORAL | 0 refills | Status: AC

## 2020-11-23 MED ORDER — PREDNISONE 50 MG PO TABS: 50.0000 mg | ORAL_TABLET | Freq: Every day | ORAL | 0 refills | Status: AC

## 2020-11-23 MED ORDER — PSEUDOEPH-BROMPHEN-DM 30-2-10 MG/5ML PO SYRP: 10.0000 mL | ORAL_SOLUTION | Freq: Four times a day (QID) | ORAL | 0 refills | Status: AC | PRN

## 2020-11-23 NOTE — ED Provider Notes (Signed)
[facility] Emergency Department Provider Note  ____________________________________________  Time seen: Approximately 6:17 PM  I have reviewed the triage vital signs and the nursing notes.   HISTORY  Chief Complaint Shortness of Breath and Covid Positive    HPI Karen David is a 25 y.o. female who presents the emergency department complaining of anterior chest wall pain, shortness of breath.  Patient states that she was diagnosed with COVID 5 days ago.  Patient states that she has had progressive symptoms, has followed up with student health twice.  Today she developed pain along the anterior aspect of her chest and was diagnosed with "pneumonia" at student health and costochondritis.  Patient had been prescribed antibiotics, she been previously prescribed steroids 3 days ago.  Patient states that the pain is worse with movement, deep inspiration and palpation along the chest wall.  History of anxiety, depression, sinusitis.         Past Medical History:  Diagnosis Date  . Anxiety   . Depression   . Deviated septum     Patient Active Problem List   Diagnosis Date Noted  . Sinusitis 08/28/2018    Past Surgical History:  Procedure Laterality Date  . CHOLECYSTECTOMY    . TONSILLECTOMY      Prior to Admission medications   Medication Sig Start Date End Date Taking? Authorizing Provider  flavoxATE (URISPAS) 100 MG tablet Take 1 tablet (100 mg total) by mouth 3 (three) times daily as needed for bladder spasms. 11/20/17   Menshew, Charlesetta Ivory, PA-C  folic acid (FOLVITE) 1 MG tablet Take 1 mg by mouth daily.    [provider]  HYDROcodone-acetaminophen (NORCO) 5-325 MG tablet Take 1 tablet by mouth every 6 (six) hours as needed for moderate pain. 07/24/18   Irean Hong, MD  ibuprofen (ADVIL,MOTRIN) 600 MG tablet Take 1 tablet (600 mg total) by mouth every 8 (eight) hours as needed. 07/24/18   Irean Hong, MD  ondansetron (ZOFRAN ODT) 4 MG  disintegrating tablet Take 1 tablet (4 mg total) by mouth every 8 (eight) hours as needed for nausea or vomiting. 07/24/18   Irean Hong, MD  sertraline (ZOLOFT) 100 MG tablet Take 100 mg by mouth daily.    [provider]  Vitamin D, Ergocalciferol, (DRISDOL) 1.25 MG (50000 UT) CAPS capsule Take 1 capsule (50,000 Units total) by mouth every 7 (seven) days. 10/04/18   Judi Saa, DO    Allergies Patient has no known allergies.  History reviewed. No pertinent family history.  Social History Social History   Tobacco Use  . Smoking status: Never Smoker  . Smokeless tobacco: Never Used  Substance Use Topics  . Alcohol use: Yes    Comment: occ  . Drug use: Not Currently     Review of Systems  Constitutional: No fever/chills Eyes: No visual changes. No discharge ENT: No upper respiratory complaints. Cardiovascular: Positive for chest pain/chest wall pain Respiratory: Positive cough. No SOB. Gastrointestinal: No abdominal pain.  No nausea, no vomiting.  No diarrhea.  No constipation. Musculoskeletal: Negative for musculoskeletal pain. Skin: Negative for rash, abrasions, lacerations, ecchymosis. Neurological: Negative for headaches, focal weakness or numbness.  10 System ROS otherwise negative.  ____________________________________________   PHYSICAL EXAM:  VITAL SIGNS: ED Triage Vitals  Enc Vitals Group     BP 11/23/20 1602 134/76     Pulse Rate 11/23/20 1602 90     Resp 11/23/20 1602 (!) 22     Temp  11/23/20 1602 98.3 F (36.8 C)     Temp Source 11/23/20 1602 Oral     SpO2 11/23/20 1602 97 %     Weight 11/23/20 1604 125 lb (56.7 kg)     Height 11/23/20 1604 5\' 4"  (1.626 m)     Head Circumference --      Peak Flow --      Pain Score 11/23/20 1604 10     Pain Loc --      Pain Edu? --      Excl. in GC? --      Constitutional: Alert and oriented. Well appearing and in no acute distress. Eyes: Conjunctivae are normal. PERRL. EOMI. Head:  Atraumatic. ENT:      Ears: EACs and TMs unremarkable bilaterally      Nose: No congestion/rhinnorhea.      Mouth/Throat: Mucous membranes are moist.  Neck: No stridor.   Hematological/Lymphatic/Immunilogical: No cervical lymphadenopathy. Cardiovascular: Normal rate, regular rhythm. Normal S1 and S2.  Good peripheral circulation. Respiratory: Normal respiratory effort without tachypnea or retractions. Lungs CTAB with no wheezing, rales or rhonchi.01/23/21 air entry to the bases with no decreased or absent breath sounds. Gastrointestinal: Bowel sounds 4 quadrants. Soft and nontender to palpation. No guarding or rigidity. No palpable masses. No distention. No CVA tenderness. Musculoskeletal: Full range of motion to all extremities. No gross deformities appreciated. Neurologic:  Normal speech and language. No gross focal neurologic deficits are appreciated.  Skin:  Skin is warm, dry and intact. No rash noted. Psychiatric: Mood and affect are normal. Speech and behavior are normal. Patient exhibits appropriate insight and judgement.   ____________________________________________   LABS (all labs ordered are listed, but only abnormal results are displayed)  Labs Reviewed  BASIC METABOLIC PANEL - Abnormal; Notable for the following components:      Result Value   Glucose, Bld 107 (*)    All other components within normal limits  CBC  HEPATIC FUNCTION PANEL  POC URINE PREG, ED  TROPONIN I (HIGH SENSITIVITY)  TROPONIN I (HIGH SENSITIVITY)   ____________________________________________  EKG  ED ECG REPORT I, Peri Jefferson Angelic Schnelle,  personally viewed and interpreted this ECG.   Date: 11/23/2020  EKG Time: 1601 hrs.  Rate: 93 bpm  Rhythm: there are no previous tracings available for comparison, normal sinus rhythm, sinus arrhythmia  Axis: Rightward axis deviation  Intervals:none  ST&T Change: No ST elevation or depression noted  Normal sinus rhythm.  Sinus arrhythmia.  No STEMI.  No  previous EKG for comparison.  ____________________________________________  RADIOLOGY I personally viewed and evaluated these images as part of my medical decision making, as well as reviewing the written report by the radiologist.  ED Provider Interpretation: Evaluation of chest x-ray and CT angio chest revealed no consolidation concerning for pneumonia.  CT revealed no evidence of PE.  DG Chest 2 View  Result Date: 11/23/2020 CLINICAL DATA:  Recently diagnosed with pneumonia. EXAM: CHEST - 2 VIEW COMPARISON:  May 11, 2017 FINDINGS: The heart size and mediastinal contours are within normal limits. Both lungs are clear. The visualized skeletal structures are unremarkable. IMPRESSION: No active cardiopulmonary disease. Electronically Signed   By: May 13, 2017 M.D.   On: 11/23/2020 17:02   CT Angio Chest PE W and/or Wo Contrast  Result Date: 11/23/2020 CLINICAL DATA:  Pulmonary embolus suspected with high probability. Recent COVID infection. Worsening pleuritic chest pain and shortness of breath. EXAM: CT ANGIOGRAPHY CHEST WITH CONTRAST TECHNIQUE: Multidetector CT imaging of the chest was  performed using the standard protocol during bolus administration of intravenous contrast. Multiplanar CT image reconstructions and MIPs were obtained to evaluate the vascular anatomy. CONTRAST:  2mL OMNIPAQUE IOHEXOL 350 MG/ML SOLN COMPARISON:  Chest radiograph 11/23/2020 FINDINGS: Cardiovascular: Good opacification of the central and segmental pulmonary arteries. No focal filling defects. No evidence of significant pulmonary embolus. Normal caliber thoracic aorta. No evidence of aortic dissection. Normal heart size. No pericardial effusions. Mediastinum/Nodes: No significant lymphadenopathy. Esophagus is decompressed. Thyroid gland is unremarkable. Lungs/Pleura: Lungs are clear and expanded. No pleural effusions. No pneumothorax. Airways are patent. Upper Abdomen: No acute abnormalities demonstrated in the  visualized upper abdomen. Musculoskeletal: No destructive bone lesions. Review of the MIP images confirms the above findings. IMPRESSION: 1. No evidence of significant pulmonary embolus. 2. No evidence of active pulmonary disease. Electronically Signed   By: Burman Nieves M.D.   On: 11/23/2020 20:39    ____________________________________________    PROCEDURES  Procedure(s) performed:    Procedures    Medications  iohexol (OMNIPAQUE) 350 MG/ML injection 75 mL (75 mLs Intravenous Contrast Given 11/23/20 2020)     ____________________________________________   INITIAL IMPRESSION / ASSESSMENT AND PLAN / ED COURSE  Pertinent labs & imaging results that were available during my care of the patient were reviewed by me and considered in my medical decision making (see chart for details).  Review of the [place] CSRS was performed in accordance of the [facility] prior to dispensing any controlled drugs.           Patient's diagnosis is consistent with costochondral pain, COVID.  Patient was diagnosed with COVID 5 days ago.  Patient has had ongoing/worsening symptoms for the past several days.  She was initially started on a steroid, then was transitioned to an antibiotic today.  Patient reports chest/chest wall pain.  It is reproduced on physical exam.  Labs, chest x-ray, EKG, CT scan are reassuring at this time.  Findings are consistent with ongoing Covid symptoms and costochondral pain.  Patient will be placed on 5-day course of steroid, continue albuterol already prescribed.  Patient will have Tessalon Perles and Bromfed for symptom relief.  Return precautions discussed with the patient.  I discussed the patient's results, recommendations with her mother via telephone as well.  Patient is aware of signs and symptoms to return to the emergency department for.  Otherwise she may follow-up with student health.     ____________________________________________  FINAL CLINICAL IMPRESSION(S) / ED  DIAGNOSES  Final diagnoses:  COVID-19  Costochondritis      NEW MEDICATIONS STARTED DURING THIS VISIT:  ED Discharge Orders    None          This chart was dictated using voice recognition software/Dragon. Despite best efforts to proofread, errors can occur which can change the meaning. Any change was purely unintentional.    Racheal Patches, PA-C 11/23/20 2136    Sharyn Creamer, MD 11/24/20 1140

## 2020-11-23 NOTE — ED Triage Notes (Signed)
Presents today with sob and cp. [facility] diagnosed patient with pneumonia and chondritis today. Prescribed antibiotics. Positive covid 11/18/20 and 11/20/20 prescribed steroids.

## 2020-11-29 ENCOUNTER — Emergency Department
Admission: EM | Admit: 2020-11-29 | Discharge: 2020-11-29 | Disposition: A | Discharge status: Home / Self Care | Payer: Managed Care, Other (non HMO) | Attending: Emergency Medicine | Admitting: Emergency Medicine

## 2020-11-29 ENCOUNTER — Other Ambulatory Visit: Payer: Self-pay

## 2020-11-29 ENCOUNTER — Encounter: Payer: Self-pay | Admitting: Intensive Care

## 2020-11-29 DIAGNOSIS — Z8616 Personal history of COVID-19: Secondary | ICD-10-CM | POA: Diagnosis not present

## 2020-11-29 DIAGNOSIS — B3323 Viral pericarditis: Secondary | ICD-10-CM | POA: Diagnosis not present

## 2020-11-29 DIAGNOSIS — R079 Chest pain, unspecified: Secondary | ICD-10-CM | POA: Diagnosis present

## 2020-11-29 DIAGNOSIS — I301 Infective pericarditis: Secondary | ICD-10-CM

## 2020-11-29 HISTORY — DX: COVID-19: U07.1

## 2020-11-29 LAB — BASIC METABOLIC PANEL
Anion gap: 8 (ref 5–15)
BUN: 17 mg/dL (ref 6–20)
CO2: 27 mmol/L (ref 22–32)
Calcium: 8.8 mg/dL — ABNORMAL LOW (ref 8.9–10.3)
Chloride: 105 mmol/L (ref 98–111)
Creatinine, Ser: 0.72 mg/dL (ref 0.44–1.00)
GFR, Estimated: >60 mL/min (ref >60)
Glucose, Bld: 91 mg/dL (ref 70–99)
Potassium: 3.7 mmol/L (ref 3.5–5.1)
Sodium: 140 mmol/L (ref 135–145)

## 2020-11-29 LAB — CBC
HCT: 42.6 % (ref 36.0–46.0)
Hemoglobin: 14.1 g/dL (ref 12.0–15.0)
MCH: 29.7 pg (ref 26.0–34.0)
MCHC: 33.1 g/dL (ref 30.0–36.0)
MCV: 89.9 fL (ref 80.0–100.0)
Platelets: 441 10*3/uL — ABNORMAL HIGH (ref 150–400)
RBC: 4.74 MIL/uL (ref 3.87–5.11)
RDW: 12.9 % (ref 11.5–15.5)
WBC: 10.1 10*3/uL (ref 4.0–10.5)
nRBC: 0 % (ref 0.0–0.2)

## 2020-11-29 LAB — TROPONIN I (HIGH SENSITIVITY)
Troponin I (High Sensitivity): 3 ng/L (ref <18)
Troponin I (High Sensitivity): 2 ng/L (ref <18)

## 2020-11-29 MED ORDER — PANTOPRAZOLE SODIUM 40 MG IV SOLR
40.0000 mg | Freq: Once | INTRAVENOUS | Status: AC
  Administered 2020-11-29: 40 mg via INTRAVENOUS
  Filled 2020-11-29: qty 40

## 2020-11-29 MED ORDER — FAMOTIDINE 20 MG PO TABS: 20.0000 mg | ORAL_TABLET | Freq: Two times a day (BID) | ORAL | 0 refills | Status: AC

## 2020-11-29 MED ORDER — KETOROLAC TROMETHAMINE 30 MG/ML IJ SOLN
15.0000 mg | INTRAMUSCULAR | Status: AC
  Administered 2020-11-29: 15 mg via INTRAVENOUS
  Filled 2020-11-29: qty 1

## 2020-11-29 MED ORDER — COLCHICINE 0.6 MG PO TABS: 0.6000 mg | ORAL_TABLET | Freq: Every day | ORAL | 0 refills | Status: AC

## 2020-11-29 MED ORDER — SODIUM CHLORIDE 0.9 % IV BOLUS
1000.0000 mL | Freq: Once | INTRAVENOUS | Status: AC
  Administered 2020-11-29: 1000 mL via INTRAVENOUS

## 2020-11-29 MED ORDER — COLCHICINE 0.6 MG PO TABS
0.6000 mg | ORAL_TABLET | ORAL | Status: AC
  Administered 2020-11-29: 0.6 mg via ORAL
  Filled 2020-11-29: qty 1

## 2020-11-29 MED ORDER — DEXAMETHASONE SODIUM PHOSPHATE 10 MG/ML IJ SOLN
10.0000 mg | Freq: Once | INTRAMUSCULAR | Status: AC
  Administered 2020-11-29: 10 mg via INTRAVENOUS
  Filled 2020-11-29: qty 1

## 2020-11-29 MED ORDER — NAPROXEN 500 MG PO TABS: 500.0000 mg | ORAL_TABLET | Freq: Two times a day (BID) | ORAL | 0 refills | Status: AC

## 2020-11-29 MED ORDER — ONDANSETRON HCL 4 MG/2ML IJ SOLN
4.0000 mg | Freq: Once | INTRAMUSCULAR | Status: AC
  Administered 2020-11-29: 4 mg via INTRAVENOUS
  Filled 2020-11-29: qty 2

## 2020-11-29 NOTE — Discharge Instructions (Signed)
I suspect your symptoms are due to viral pericarditis.  You should take naproxen twice a day and colchicine once a day for the next month to help resolve your symptoms.  If symptoms do not resolve, you may need to continue taking these medications for longer.  Take famotidine during this time to protect your stomach from side effects of these medicines.  Please follow up with primary care and cardiology for continued monitoring of your symptoms.

## 2020-11-29 NOTE — ED Triage Notes (Signed)
Patient c/o stabbing chest pain and stated "I literally feel like someone is stabbing me in the heart and I am still congested." Today is day 14 with covid symptoms

## 2020-11-29 NOTE — ED Notes (Signed)
Pt medicated per MAR. Up ad lib to toilet. AO x4. Breathing remains regular and unlabored

## 2020-11-29 NOTE — ED Notes (Signed)
Assumed care of pt at 1900, pt reports decreased cp at rest, worse p coughing spell. States cough was productive and no remains dry. Symptoms started x12 days. Awaiting trop results and re-eval. AO x4. Talking in full sentences with regular and unlabored breathing

## 2020-11-29 NOTE — ED Provider Notes (Signed)
[facility] Emergency Department Provider Note  ____________________________________________  Time seen: Approximately 8:39 PM  I have reviewed the triage vital signs and the nursing notes.   HISTORY  Chief Complaint Chest Pain    HPI Karen David is a 25 y.o. female with a past history of anxiety, COVID-19 2 weeks ago who complains of chest pain that is been going on for the past 12 days.  Associated fatigue.  Sharp, nonradiating, worse lying down, better sitting upright.  Denies shortness of breath.  Not pleuritic.  Constant.  Severe.  Was seen in the ED previously, had a CT angiogram that was negative for PE or other acute findings.      Past Medical History:  Diagnosis Date  . Anxiety   . COVID-19   . Depression   . Deviated septum      Patient Active Problem List   Diagnosis Date Noted  . Sinusitis 08/28/2018     Past Surgical History:  Procedure Laterality Date  . CHOLECYSTECTOMY    . TONSILLECTOMY       Prior to Admission medications   Medication Sig Start Date End Date Taking? Authorizing Provider  colchicine 0.6 MG tablet Take 1 tablet (0.6 mg total) by mouth daily. 11/29/20 12/29/20 Yes Sharman Cheek, MD  famotidine (PEPCID) 20 MG tablet Take 1 tablet (20 mg total) by mouth 2 (two) times daily. 11/29/20 12/29/20 Yes Sharman Cheek, MD  naproxen (NAPROSYN) 500 MG tablet Take 1 tablet (500 mg total) by mouth 2 (two) times daily with a meal. 11/29/20 12/29/20 Yes Sharman Cheek, MD  benzonatate (TESSALON PERLES) 100 MG capsule Take 1 capsule (100 mg total) by mouth every 6 (six) hours as needed for cough. 11/23/20 11/23/21  Cuthriell, Delorise Royals, PA-C  brompheniramine-pseudoephedrine-DM 30-2-10 MG/5ML syrup Take 10 mLs by mouth 4 (four) times daily as needed. 11/23/20   Cuthriell, Delorise Royals, PA-C  flavoxATE (URISPAS) 100 MG tablet Take 1 tablet (100 mg total) by mouth 3 (three) times daily as needed for bladder spasms. 11/20/17   Menshew,  Charlesetta Ivory, PA-C  folic acid (FOLVITE) 1 MG tablet Take 1 mg by mouth daily.    [provider]  HYDROcodone-acetaminophen (NORCO) 5-325 MG tablet Take 1 tablet by mouth every 6 (six) hours as needed for moderate pain. 07/24/18   Irean Hong, MD  ibuprofen (ADVIL,MOTRIN) 600 MG tablet Take 1 tablet (600 mg total) by mouth every 8 (eight) hours as needed. 07/24/18   Irean Hong, MD  meloxicam (MOBIC) 15 MG tablet Take 1 tablet (15 mg total) by mouth daily. 11/23/20   Cuthriell, Delorise Royals, PA-C  ondansetron (ZOFRAN ODT) 4 MG disintegrating tablet Take 1 tablet (4 mg total) by mouth every 8 (eight) hours as needed for nausea or vomiting. 07/24/18   Irean Hong, MD  predniSONE (DELTASONE) 50 MG tablet Take 1 tablet (50 mg total) by mouth daily with breakfast. 11/23/20   Cuthriell, Delorise Royals, PA-C  sertraline (ZOLOFT) 100 MG tablet Take 100 mg by mouth daily.    [provider]  Vitamin D, Ergocalciferol, (DRISDOL) 1.25 MG (50000 UT) CAPS capsule Take 1 capsule (50,000 Units total) by mouth every 7 (seven) days. 10/04/18   Judi Saa, DO     Allergies Patient has no known allergies.   History reviewed. No pertinent family history.  Social History Social History   Tobacco Use  . Smoking status: Never Smoker  . Smokeless tobacco: Never Used  Substance Use  Topics  . Alcohol use: Yes    Comment: occ  . Drug use: Not Currently    Review of Systems  Constitutional:   No fever or chills.  ENT:   No sore throat. No rhinorrhea. Cardiovascular: Positive chest pain as above without dizziness or syncope. Respiratory:   No dyspnea or cough. Gastrointestinal:   Negative for abdominal pain, vomiting and diarrhea.  Musculoskeletal:   Negative for focal pain or swelling All other systems reviewed and are negative except as documented above in ROS and HPI.  ____________________________________________   PHYSICAL EXAM:  VITAL SIGNS: ED Triage Vitals  Enc Vitals Group      BP 11/29/20 1802 111/75     Pulse Rate 11/29/20 1802 64     Resp 11/29/20 1802 20     Temp 11/29/20 1802 98.5 F (36.9 C)     Temp Source 11/29/20 1802 Oral     SpO2 11/29/20 1802 98 %     Weight 11/29/20 1754 125 lb (56.7 kg)     Height 11/29/20 1754 5\' 4"  (1.626 m)     Head Circumference --      Peak Flow --      Pain Score 11/29/20 1754 10     Pain Loc --      Pain Edu? --      Excl. in GC? --     Vital signs reviewed, nursing assessments reviewed.   Constitutional:   Alert and oriented. Non-toxic appearance. Eyes:   Conjunctivae are normal. EOMI. PERRL. ENT      Head:   Normocephalic and atraumatic.      Nose:   Wearing a mask.      Mouth/Throat:   Wearing a mask.      Neck:   No meningismus. Full ROM. Hematological/Lymphatic/Immunilogical:   No cervical lymphadenopathy. Cardiovascular:   RRR. Symmetric bilateral radial and DP pulses.  No murmurs, crunch, or rub. Cap refill less than 2 seconds. Respiratory:   Normal respiratory effort without tachypnea/retractions. Breath sounds are clear and equal bilaterally. No wheezes/rales/rhonchi. Gastrointestinal:   Soft and nontender. Non distended. There is no CVA tenderness.  No rebound, rigidity, or guarding. Musculoskeletal:   Normal range of motion in all extremities. No joint effusions.  No lower extremity tenderness.  No edema. Neurologic:   Normal speech and language.  Motor grossly intact. No acute focal neurologic deficits are appreciated.  Skin:    Skin is warm, dry and intact. No rash noted.  No petechiae, purpura, or bullae.  ____________________________________________    LABS (pertinent positives/negatives) (all labs ordered are listed, but only abnormal results are displayed) Labs Reviewed  BASIC METABOLIC PANEL - Abnormal; Notable for the following components:      Result Value   Calcium 8.8 (*)    All other components within normal limits  CBC - Abnormal; Notable for the following components:    Platelets 441 (*)    All other components within normal limits  TROPONIN I (HIGH SENSITIVITY)  TROPONIN I (HIGH SENSITIVITY)   ____________________________________________   EKG  Interpreted by me Normal sinus rhythm rate of 72, normal axis and intervals.  Normal QRS ST segments and T waves.  There is diffuse PR depression consistent with pericarditis.  ____________________________________________    RADIOLOGY  No results found.  ____________________________________________   PROCEDURES Procedures  ____________________________________________  DIFFERENTIAL DIAGNOSIS   Pericarditis, myocarditis, costochondritis, long-haul COVID  CLINICAL IMPRESSION / ASSESSMENT AND PLAN / ED COURSE  Medications ordered in the ED: Medications  colchicine tablet 0.6 mg (has no administration in time range)  ketorolac (TORADOL) 30 MG/ML injection 15 mg (15 mg Intravenous Given 11/29/20 1902)  dexamethasone (DECADRON) injection 10 mg (10 mg Intravenous Given 11/29/20 1903)  sodium chloride 0.9 % bolus 1,000 mL (1,000 mLs Intravenous New Bag/Given 11/29/20 1905)  ondansetron (ZOFRAN) injection 4 mg (4 mg Intravenous Given 11/29/20 1902)  pantoprazole (PROTONIX) injection 40 mg (40 mg Intravenous Given 11/29/20 1904)    Pertinent labs & imaging results that were available during my care of the patient were reviewed by me and considered in my medical decision making (see chart for details).  Karen David was evaluated in Emergency Department on 11/29/2020 for the symptoms described in the history of present illness. She was evaluated in the context of the global COVID-19 pandemic, which necessitated consideration that the patient might be at risk for infection with the SARS-CoV-2 virus that causes COVID-19. Institutional protocols and algorithms that pertain to the evaluation of patients at risk for COVID-19 are in a state of rapid change based on information released by regulatory bodies including the  CDC and federal and state organizations. These policies and algorithms were followed during the patient's care in the ED.   Patient presents with severe sharp chest pain syndrome that is consistent with pericarditis.  EKG also consistent with pericarditis.  This would be post viral.  Vital signs are normal, troponin is normal.  She is feeling better after Toradol Decadron and IV fluids.  There is no sign of pericardial effusion.  Will start colchicine and naproxen, follow-up with cardiology.  Return precautions discussed.      ____________________________________________   FINAL CLINICAL IMPRESSION(S) / ED DIAGNOSES    Final diagnoses:  Acute viral pericarditis     ED Discharge Orders         Ordered    naproxen (NAPROSYN) 500 MG tablet  2 times daily with meals        11/29/20 2036    famotidine (PEPCID) 20 MG tablet  2 times daily        11/29/20 2036    colchicine 0.6 MG tablet  Daily        11/29/20 2036          Portions of this note were generated with dragon dictation software. Dictation errors may occur despite best attempts at proofreading.   Sharman Cheek, MD 11/29/20 2105

## 2020-12-02 ENCOUNTER — Other Ambulatory Visit: Payer: Self-pay

## 2020-12-02 ENCOUNTER — Emergency Department
Admission: EM | Admit: 2020-12-02 | Discharge: 2020-12-02 | Disposition: A | Discharge status: Home / Self Care | Payer: Managed Care, Other (non HMO) | Attending: Emergency Medicine | Admitting: Emergency Medicine

## 2020-12-02 ENCOUNTER — Emergency Department: Payer: Managed Care, Other (non HMO)

## 2020-12-02 DIAGNOSIS — Z8616 Personal history of COVID-19: Secondary | ICD-10-CM | POA: Diagnosis not present

## 2020-12-02 DIAGNOSIS — R0789 Other chest pain: Secondary | ICD-10-CM | POA: Diagnosis present

## 2020-12-02 DIAGNOSIS — R1013 Epigastric pain: Secondary | ICD-10-CM | POA: Insufficient documentation

## 2020-12-02 LAB — BASIC METABOLIC PANEL
Anion gap: 6 (ref 5–15)
BUN: 12 mg/dL (ref 6–20)
CO2: 26 mmol/L (ref 22–32)
Calcium: 9.2 mg/dL (ref 8.9–10.3)
Chloride: 106 mmol/L (ref 98–111)
Creatinine, Ser: 0.69 mg/dL (ref 0.44–1.00)
GFR, Estimated: >60 mL/min (ref >60)
Glucose, Bld: 79 mg/dL (ref 70–99)
Potassium: 3.8 mmol/L (ref 3.5–5.1)
Sodium: 138 mmol/L (ref 135–145)

## 2020-12-02 LAB — URINALYSIS, COMPLETE (UACMP) WITH MICROSCOPIC
Bacteria, UA: NONE SEEN
Bilirubin Urine: NEGATIVE
Glucose, UA: NEGATIVE mg/dL
Ketones, ur: NEGATIVE mg/dL
Nitrite: NEGATIVE
Protein, ur: NEGATIVE mg/dL
Specific Gravity, Urine: >1.046 — ABNORMAL HIGH (ref 1.005–1.030)
pH: 7 (ref 5.0–8.0)

## 2020-12-02 LAB — CBC WITH DIFFERENTIAL/PLATELET
Abs Immature Granulocytes: 0.04 10*3/uL (ref 0.00–0.07)
Basophils Absolute: 0 10*3/uL (ref 0.0–0.1)
Basophils Relative: 0 %
Eosinophils Absolute: 0.2 10*3/uL (ref 0.0–0.5)
Eosinophils Relative: 2 %
HCT: 45.4 % (ref 36.0–46.0)
Hemoglobin: 15.5 g/dL — ABNORMAL HIGH (ref 12.0–15.0)
Immature Granulocytes: 0 %
Lymphocytes Relative: 48 %
Lymphs Abs: 4.4 10*3/uL — ABNORMAL HIGH (ref 0.7–4.0)
MCH: 30 pg (ref 26.0–34.0)
MCHC: 34.1 g/dL (ref 30.0–36.0)
MCV: 87.8 fL (ref 80.0–100.0)
Monocytes Absolute: 0.7 10*3/uL (ref 0.1–1.0)
Monocytes Relative: 8 %
Neutro Abs: 3.9 10*3/uL (ref 1.7–7.7)
Neutrophils Relative %: 42 %
Platelets: 395 10*3/uL (ref 150–400)
RBC: 5.17 MIL/uL — ABNORMAL HIGH (ref 3.87–5.11)
RDW: 13.1 % (ref 11.5–15.5)
WBC: 9.2 10*3/uL (ref 4.0–10.5)
nRBC: 0 % (ref 0.0–0.2)

## 2020-12-02 LAB — TROPONIN I (HIGH SENSITIVITY)
Troponin I (High Sensitivity): 3 ng/L (ref <18)
Troponin I (High Sensitivity): 3 ng/L (ref <18)

## 2020-12-02 LAB — HEPATIC FUNCTION PANEL
ALT: 20 U/L (ref 0–44)
AST: 17 U/L (ref 15–41)
Albumin: 4.1 g/dL (ref 3.5–5.0)
Alkaline Phosphatase: 62 U/L (ref 38–126)
Bilirubin, Direct: <0.1 mg/dL (ref 0.0–0.2)
Total Bilirubin: 0.7 mg/dL (ref 0.3–1.2)
Total Protein: 6.6 g/dL (ref 6.5–8.1)

## 2020-12-02 LAB — PREGNANCY, URINE: Preg Test, Ur: NEGATIVE

## 2020-12-02 LAB — LIPASE, BLOOD: Lipase: 54 U/L — ABNORMAL HIGH (ref 11–51)

## 2020-12-02 LAB — POC URINE PREG, ED: Preg Test, Ur: NEGATIVE

## 2020-12-02 MED ORDER — IOHEXOL 300 MG/ML  SOLN
75.0000 mL | Freq: Once | INTRAMUSCULAR | Status: AC | PRN
  Administered 2020-12-02: 75 mL via INTRAVENOUS

## 2020-12-02 MED ORDER — KETOROLAC TROMETHAMINE 30 MG/ML IJ SOLN
30.0000 mg | Freq: Once | INTRAMUSCULAR | Status: AC
  Administered 2020-12-02: 30 mg via INTRAVENOUS
  Filled 2020-12-02: qty 1

## 2020-12-02 MED ORDER — MORPHINE SULFATE (PF) 4 MG/ML IV SOLN
4.0000 mg | Freq: Once | INTRAVENOUS | Status: DC
  Filled 2020-12-02: qty 1

## 2020-12-02 MED ORDER — TRAMADOL HCL 50 MG PO TABS: 50.0000 mg | ORAL_TABLET | Freq: Four times a day (QID) | ORAL | 0 refills | Status: AC | PRN

## 2020-12-02 NOTE — ED Notes (Signed)
Called lab. They will run new ordered labs off of previous collection.

## 2020-12-02 NOTE — ED Notes (Signed)
EDP at bedside  

## 2020-12-02 NOTE — ED Notes (Signed)
Pt is crying and appears anxious. States has "the worst pain in my life" in central chest radiating to both arms. Describes pain as sharp. Mother on phone asking questions re: plan of care. Explained that ekg was done, labs will be drawn, and EDP will eval pt as soon as possible. Pt crying.

## 2020-12-02 NOTE — ED Notes (Signed)
Pt to xray

## 2020-12-02 NOTE — ED Triage Notes (Signed)
Pt arrives to ER from home for central CP. Seen last week and week before for same. Has cardiody appt tomorrow but states was dx with pericarditis last visit. Cough present. Pt dx with COVID >14 days ago. Pt appears anxious.

## 2020-12-02 NOTE — Discharge Instructions (Addendum)
You should continue taking the pepcid.  It is possible that you have gastritis or acid reflux so you should avoid spicy or fatty foods, acidic foods, and alcohol.    Discontinue the naproxen and colchicine for now at least until you follow up with cardiology tomorrow.  Take the tramadol if needed for recurrent pain.  Return to the ER for new, worsening or persistent pain, shortness of breath, weakness or lightheadedness, or any other new or worsening symptoms that concern you.   Return to the

## 2020-12-02 NOTE — ED Provider Notes (Signed)
[facility] Emergency Department Provider Note ____________________________________________   Event Date/Time   First MD Initiated Contact with Patient 12/02/20 1045     (approximate)  I have reviewed the triage vital signs and the nursing notes.   HISTORY  Chief Complaint Chest Pain    HPI LESHA JAGER is a 25 y.o. female with PMH as noted below including a diagnosis of COVID-19 last month along with a recent diagnosis of pericarditis who presents with persistent chest and epigastric pain over the last 10 days.  She initially felt it mainly in the chest although it is now radiating down into the abdomen.  She reports associated nausea but no vomiting along with shortness of breath and anxiety.  She has had some diarrhea but no fever.  She states that the pain has been constant and not relieved by naproxen and colchicine prescribed on her last visit.  It is worse with exertion.  She states that the pain feels similar to when she had gallstones, however she had cholecystectomy when she was in eighth grade.  Past Medical History:  Diagnosis Date  . Anxiety   . COVID-19   . Depression   . Deviated septum     Patient Active Problem List   Diagnosis Date Noted  . Sinusitis 08/28/2018    Past Surgical History:  Procedure Laterality Date  . CHOLECYSTECTOMY    . TONSILLECTOMY      Prior to Admission medications   Medication Sig Start Date End Date Taking? Authorizing Provider  traMADol (ULTRAM) 50 MG tablet Take 1 tablet (50 mg total) by mouth every 6 (six) hours as needed for up to 3 days. 12/02/20 12/05/20 Yes Dionne Bucy, MD  benzonatate (TESSALON PERLES) 100 MG capsule Take 1 capsule (100 mg total) by mouth every 6 (six) hours as needed for cough. 11/23/20 11/23/21  Cuthriell, Delorise Royals, PA-C  brompheniramine-pseudoephedrine-DM 30-2-10 MG/5ML syrup Take 10 mLs by mouth 4 (four) times daily as needed. 11/23/20   Cuthriell, Delorise Royals, PA-C   colchicine 0.6 MG tablet Take 1 tablet (0.6 mg total) by mouth daily. 11/29/20 12/29/20  Sharman Cheek, MD  famotidine (PEPCID) 20 MG tablet Take 1 tablet (20 mg total) by mouth 2 (two) times daily. 11/29/20 12/29/20  Sharman Cheek, MD  flavoxATE (URISPAS) 100 MG tablet Take 1 tablet (100 mg total) by mouth 3 (three) times daily as needed for bladder spasms. 11/20/17   Menshew, Charlesetta Ivory, PA-C  folic acid (FOLVITE) 1 MG tablet Take 1 mg by mouth daily.    [provider]  HYDROcodone-acetaminophen (NORCO) 5-325 MG tablet Take 1 tablet by mouth every 6 (six) hours as needed for moderate pain. 07/24/18   Irean Hong, MD  ibuprofen (ADVIL,MOTRIN) 600 MG tablet Take 1 tablet (600 mg total) by mouth every 8 (eight) hours as needed. 07/24/18   Irean Hong, MD  meloxicam (MOBIC) 15 MG tablet Take 1 tablet (15 mg total) by mouth daily. 11/23/20   Cuthriell, Delorise Royals, PA-C  naproxen (NAPROSYN) 500 MG tablet Take 1 tablet (500 mg total) by mouth 2 (two) times daily with a meal. 11/29/20 12/29/20  Sharman Cheek, MD  ondansetron (ZOFRAN ODT) 4 MG disintegrating tablet Take 1 tablet (4 mg total) by mouth every 8 (eight) hours as needed for nausea or vomiting. 07/24/18   Irean Hong, MD  predniSONE (DELTASONE) 50 MG tablet Take 1 tablet (50 mg total) by mouth daily with breakfast. 11/23/20   Cuthriell, Christiane Ha  D, PA-C  sertraline (ZOLOFT) 100 MG tablet Take 100 mg by mouth daily.    [provider]  Vitamin D, Ergocalciferol, (DRISDOL) 1.25 MG (50000 UT) CAPS capsule Take 1 capsule (50,000 Units total) by mouth every 7 (seven) days. 10/04/18   Judi Saa, DO    Allergies Patient has no known allergies.  History reviewed. No pertinent family history.  Social History Social History   Tobacco Use  . Smoking status: Never Smoker  . Smokeless tobacco: Never Used  Substance Use Topics  . Alcohol use: Yes    Comment: occ  . Drug use: Not Currently    Review of  Systems  Constitutional: No fever/chills Eyes: No visual changes. ENT: No sore throat. Cardiovascular: Positive for chest pain. Respiratory: Positive for shortness of breath. Gastrointestinal: Positive for nausea.  No vomiting or diarrhea.  Genitourinary: Negative for flank pain. Musculoskeletal: Positive for back pain. Skin: Negative for rash. Neurological: Negative for headache.   ____________________________________________   PHYSICAL EXAM:  VITAL SIGNS: ED Triage Vitals  Enc Vitals Group     BP 12/02/20 1040 131/88     Pulse Rate 12/02/20 1040 (!) 106     Resp 12/02/20 1040 20     Temp 12/02/20 1040 (!) 97.4 F (36.3 C)     Temp Source 12/02/20 1040 Oral     SpO2 12/02/20 1040 98 %     Weight 12/02/20 1038 125 lb (56.7 kg)     Height 12/02/20 1038 5\' 4"  (1.626 m)     Head Circumference --      Peak Flow --      Pain Score 12/02/20 1038 10     Pain Loc --      Pain Edu? --      Excl. in GC? --     Constitutional: Alert and oriented.  Uncomfortable appearing, anxious, tearful.  Eyes: Conjunctivae are normal.  Head: Atraumatic. Nose: No congestion/rhinnorhea. Mouth/Throat: Mucous membranes are moist.   Neck: Normal range of motion.  Cardiovascular: Tachycardic, regular rhythm. Grossly normal heart sounds.  Good peripheral circulation. Respiratory: Normal respiratory effort.  No retractions. Lungs CTAB. Gastrointestinal: Soft with mild epigastric tenderness.  No distention.  Genitourinary: No flank tenderness. Musculoskeletal: No lower extremity edema.  Extremities warm and well perfused.  Neurologic:  Normal speech and language. No gross focal neurologic deficits are appreciated.  Skin:  Skin is warm and dry. No rash noted. Psychiatric: Anxious appearing, tearful.  ____________________________________________   LABS (all labs ordered are listed, but only abnormal results are displayed)  Labs Reviewed  CBC WITH DIFFERENTIAL/PLATELET - Abnormal; Notable for  the following components:      Result Value   RBC 5.17 (*)    Hemoglobin 15.5 (*)    Lymphs Abs 4.4 (*)    All other components within normal limits  LIPASE, BLOOD - Abnormal; Notable for the following components:   Lipase 54 (*)    All other components within normal limits  BASIC METABOLIC PANEL  HEPATIC FUNCTION PANEL  URINALYSIS, COMPLETE (UACMP) WITH MICROSCOPIC  PREGNANCY, URINE  POC URINE PREG, ED  TROPONIN I (HIGH SENSITIVITY)  TROPONIN I (HIGH SENSITIVITY)   ____________________________________________  EKG  ED ECG REPORT I, 02/01/21, the attending physician, personally viewed and interpreted this ECG.  Date: 12/02/2020 EKG Time: 1044 Rate: 98 Rhythm: normal sinus rhythm QRS Axis: normal Intervals: normal ST/T Wave abnormalities: normal Narrative Interpretation: no evidence of acute ischemia  ____________________________________________  RADIOLOGY  Chest x-ray interpreted by  me shows no focal infiltrate or edema CT abdomen/pelvis: no acute abnormality  ____________________________________________   PROCEDURES  Procedure(s) performed: No  Procedures  Critical Care performed: No ____________________________________________   INITIAL IMPRESSION / ASSESSMENT AND PLAN / ED COURSE  Pertinent labs & imaging results that were available during my care of the patient were reviewed by me and considered in my medical decision making (see chart for details).  25 year old female with PMH as noted above presents with persistent chest and epigastric pain over approximately last 10 days associated with nausea, shortness of breath, and anxiety.  I reviewed the past medical records in epic.  The patient was initially diagnosed with COVID in late March.  She was seen in the ED on 4/3 for chest pain, and lab work-up, chest x-ray, and CT angio of the chest were all negative at that time.  She was thought to have costochondritis and was placed on a steroid  course.  Subsequently she was seen on 4/9 for the same symptoms.  Lab work-up was again negative at that time.  EKG was suggestive of possible pericarditis and the patient was sent home with naproxen and colchicine as well as Pepcid.  She reports no relief to her symptoms.  On exam the patient is anxious appearing, tearful, and uncomfortable.  She is tachycardic with otherwise normal vital signs.  The physical exam is remarkable for mild epigastric abdominal and chest wall tenderness.  EKG shows no acute abnormalities or change from prior.  I do not see specific findings to suggest pericarditis.  Differential includes pericarditis, costochondritis or other musculoskeletal chest wall pain, lung COVID, or possible GI etiology such as pancreatitis or less likely choledocholithiasis or other hepatobiliary cause.  Given that the patient had a negative CT angio chest 10 days ago for the same symptoms, I do not suspect PE.  We will obtain a chest x-ray, lab work-up including cardiac enzymes and hepatobiliary labs, give analgesia, and reassess.  ----------------------------------------- 3:44 PM on 12/02/2020 -----------------------------------------  Chest x-ray was unremarkable.  The lab work-up is reassuring.  Troponins are negative x2.  The lipase was slightly elevated.  Although the patient's pain does not seem consistent with pancreatitis, I obtained a CT for further evaluation to rule out acute pancreatitis or other intra-abdominal cause.  This was negative as well  The patient has now been comfortable and pain-free with normal vital signs for several hours.  Overall presentation at this time does not favor pericarditis.  I suspect that the patient may be having gastritis or GERD possibly exacerbated by the NSAIDs.  She should still follow-up with cardiology tomorrow.  Since she had no improvement on the naproxen and colchicine I have instructed her to discontinue them for now until she sees  cardiology.  She is already on Pepcid.  I have ordered some tramadol if needed for any breakthrough pain.  At this time the patient is stable for discharge home.  At her request I also discussed her work-up and follow-up plan with her mother over the phone.  Return precautions given, and she expressed understanding.  ____________________________________________   FINAL CLINICAL IMPRESSION(S) / ED DIAGNOSES  Final diagnoses:  Atypical chest pain      NEW MEDICATIONS STARTED DURING THIS VISIT:  New Prescriptions   TRAMADOL (ULTRAM) 50 MG TABLET    Take 1 tablet (50 mg total) by mouth every 6 (six) hours as needed for up to 3 days.     Note:  This document was prepared using Dragon  voice recognition software and may include unintentional dictation errors.    Dionne BucySiadecki, Kinzlee Selvy, MD 12/02/20 1546

## 2021-01-15 ENCOUNTER — Telehealth (INDEPENDENT_AMBULATORY_CARE_PROVIDER_SITE_OTHER): Payer: Commercial Managed Care - POS | Admitting: Family Medicine

## 2021-01-26 ENCOUNTER — Encounter (INDEPENDENT_AMBULATORY_CARE_PROVIDER_SITE_OTHER): Payer: Self-pay | Admitting: Family Medicine

## 2021-01-26 ENCOUNTER — Telehealth (INDEPENDENT_AMBULATORY_CARE_PROVIDER_SITE_OTHER): Payer: Commercial Managed Care - POS | Admitting: Family Medicine

## 2021-01-26 VITALS — Temp 97.1°F | Wt 135.0 lb

## 2021-01-26 DIAGNOSIS — F411 Generalized anxiety disorder: Secondary | ICD-10-CM

## 2021-01-26 DIAGNOSIS — I309 Acute pericarditis, unspecified: Secondary | ICD-10-CM

## 2021-01-26 MED ORDER — SERTRALINE HCL 100 MG PO TABS
200.0000 mg | ORAL_TABLET | Freq: Every day | ORAL | 5 refills | Status: DC
Start: 2021-01-26 — End: 2021-02-25

## 2021-01-26 MED ORDER — BUSPIRONE HCL 10 MG PO TABS
10.0000 mg | ORAL_TABLET | Freq: Every morning | ORAL | 5 refills | Status: DC
Start: 2021-01-26 — End: 2021-02-17

## 2021-01-26 MED ORDER — PROPRANOLOL HCL 20 MG PO TABS
20.0000 mg | ORAL_TABLET | Freq: Three times a day (TID) | ORAL | 2 refills | Status: DC | PRN
Start: 2021-01-26 — End: 2021-04-22

## 2021-01-26 NOTE — Progress Notes (Signed)
Subjective:      Patient ID: Christina [name] is a 25 y.o. female     Chief Complaint   Patient presents with    Anxiety      Verbal consent has been obtained from the patient to conduct visit and bill insurance for this video visit encounter, performed to minimize exposure to COVID-19 during public emergency. Patient was positively identified visually and was verified to be located in the state of [place] at the time of the visit.      Was going to college in [place].  Graduated and back home.    Currently taking sertraline 200mg   Propranolol 20mg  QID prn - pretty often -- helps some.  Taking before bed.   Burpar 10mg  daily prn    Still getting panic but not as severe  Social situations can be a trigger.  If change does not do well    REC pack for summer Firefighter.    Grad school in the fall.  [employer] in [place] - back and forth    Exercise - 3 times a week  Sleep - good 8 hours.    Had COVID in [place] 2022.  Still a lot of pain.  Seeing cardiologist on Friday - dx with pericardia.  Was on colchicine and tramadol for a bit.  Out of the tramadol.  Taking ibuprofen.     The following sections were reviewed this encounter by the provider:   Tobacco   Allergies   Meds   Problems   Med Hx   Surg Hx   Fam Hx            Review of Systems   Constitutional: Negative for activity change, appetite change, chills, fever and unexpected weight change.   HENT: Negative for congestion, ear pain, hearing loss, sinus pain, sore throat, trouble swallowing and voice change.    Eyes: Negative for visual disturbance.   Respiratory: Negative for cough and shortness of breath.    Cardiovascular: Positive for chest pain. Negative for palpitations and leg swelling.   Gastrointestinal: Negative for abdominal pain, constipation and diarrhea.   Musculoskeletal: Negative for arthralgias and joint swelling.   Neurological: Negative for syncope, weakness and headaches.   Psychiatric/Behavioral: The patient is nervous/anxious.           Temp 97.1  F (36.2 C)    Wt 61.2 kg (135 lb)    BMI 23.17 kg/m     Objective:     Physical Exam  Constitutional:       Appearance: Normal appearance.   HENT:      Head: Normocephalic and atraumatic.      Nose: Nose normal.   Pulmonary:      Effort: Pulmonary effort is normal.   Musculoskeletal:      Cervical back: Normal range of motion and neck supple.   Neurological:      General: No focal deficit present.      Mental Status: She is alert.   Psychiatric:         Mood and Affect: Mood normal.         Behavior: Behavior normal.         Thought Content: Thought content normal.          Assessment:     1. GAD (generalized anxiety disorder)  - sertraline (ZOLOFT) 100 MG tablet; Take 2 tablets (200 mg total) by mouth daily  Dispense: 60 tablet; Refill: 5  - propranolol (INDERAL) 20 MG  tablet; Take 1 tablet (20 mg total) by mouth 3 (three) times daily as needed (anxiety)  Dispense: 90 tablet; Refill: 2  - busPIRone (BUSPAR) 10 MG tablet; Take 1 tablet (10 mg total) by mouth every morning  Dispense: 30 tablet; Refill: 5    2. Acute pericarditis associated with other disease        Plan:     Has local therpasit she is seeing weekly  Med review and hx reviewed - renew current regiment - recommend f/u in 2-3 months as shehas only been on this regiment a short time    Acute pericarditis with COVID in April.  Still with daily pain - seeing the cardioogist on Friday    Immunization records not showing HPV vaccine - pt thinks she got it - will ask mom to check records -- recommend she get this if she has not    Carlena Sax, MD

## 2021-01-30 ENCOUNTER — Encounter (INDEPENDENT_AMBULATORY_CARE_PROVIDER_SITE_OTHER): Payer: Self-pay | Admitting: Cardiovascular Disease

## 2021-01-30 ENCOUNTER — Ambulatory Visit (INDEPENDENT_AMBULATORY_CARE_PROVIDER_SITE_OTHER): Payer: Commercial Managed Care - POS | Admitting: Cardiovascular Disease

## 2021-01-30 VITALS — BP 106/72 | HR 101 | Ht 64.0 in | Wt 128.0 lb

## 2021-01-30 DIAGNOSIS — R079 Chest pain, unspecified: Secondary | ICD-10-CM

## 2021-01-30 DIAGNOSIS — I309 Acute pericarditis, unspecified: Secondary | ICD-10-CM

## 2021-01-30 MED ORDER — COLCHICINE 0.6 MG PO TABS
0.6000 mg | ORAL_TABLET | Freq: Every day | ORAL | 1 refills | Status: DC
Start: 2021-01-30 — End: 2021-05-11

## 2021-01-30 NOTE — Progress Notes (Signed)
[facility] OFF[facility] CONSULTATION NOTE    [facility] OFFICE -CARDIOLOGY  [address]  [place] [address]  Dept: [phone]  Dept Fax: [phone]         Patient Name: Christina [name]    Date of Visit:  January 30, 2021  Date of Birth: [birth year]  AGE: 25 y.o.  Medical Record #: [id number]    HISTORY OF PRESENT ILLNESS    Christina [name]  is a pleasant 25 y.o. female who is being seen today for cardiovascular consultation at the request of Rivka Safer, MD  for evaluation of chest discomfort.  The patient was diagnosed with COVID infection back in March 2022.  She presented with sinus pain as well as headaches.  The next day she developed a cough with chest pain and shortness of breath.  Is quite intense at the time.  Very pleuritic type of chest pain.  She said it hurt more when she breathes then.  It also hurt when she lie down.  Intensity has lessened although it is plateaued out and is still present on a daily basis.  For period of time she was on naproxen and colchicine.  However she developed gastritis.  She was taken off of both and then put on ibuprofen.  She is no longer on ibuprofen either.  Is also on tramadol for short period of time.  She thinks he was on the colchicine for only about 3 weeks.Marland Kitchen     PAST MEDICAL HISTORY:   She has a past medical history of Anxiety, Depression, and Syncope. She has a past surgical history that includes Tonsillectomy; Cholecystectomy (2013); Rhinoplasty (2020); and Septoplasty (2020).    ALLERGIES:   No Known Allergies    MEDICATIONS:     Current Outpatient Medications:   .  albuterol sulfate HFA (PROVENTIL) 108 (90 Base) MCG/ACT inhaler, INHALE 1-2 PUFF EVERY FOUR TO SIX HOURS AS NEEDED FOR COUGH, SHORTNESS OF BREATH OR CHEST TIGHTNESS, Disp: , Rfl:   .  busPIRone (BUSPAR) 10 MG tablet, Take 1 tablet (10 mg total) by mouth every morning, Disp: 30 tablet, Rfl: 5  .  levonorgestrel (Mirena, 52 MG,) 20  MCG/24HR IUD, Place vaginally once, Disp: , Rfl:   .  propranolol (INDERAL) 20 MG tablet, Take 1 tablet (20 mg total) by mouth 3 (three) times daily as needed (anxiety), Disp: 90 tablet, Rfl: 2  .  sertraline (ZOLOFT) 100 MG tablet, Take 2 tablets (200 mg total) by mouth daily, Disp: 60 tablet, Rfl: 5  .  colchicine 0.6 MG tablet, Take 1 tablet (0.6 mg total) by mouth daily, Disp: 90 tablet, Rfl: 1     FAMILY HISTORY:   family history includes Cancer in her paternal grandfather and paternal grandmother; Heart failure in her maternal grandfather; Hypertension in her father and mother.    SOCIAL HISTORY:   She reports that she has never smoked. She has never used smokeless tobacco. She reports current alcohol use. She reports that she does not use drugs.     REVIEW OF SYSTEMS:   General: Fatigue  Integumentary: Denies any change in hair or nails, rashes, or skin lesions  Eyes: Denies diplopia, glaucoma or visual field defects.:  Ears, Nose, Throat and Mouth: Denies any hearing loss, epistaxis, hoarseness or difficulty speaking.  Respiratory: Shortness of breath with exercise  Cardiovascular: Please review HPI.  Abdominal: Nausea  Musculoskeletal: Denies any venous insufficiency, arthritic symptoms or back  problems.  Neurological: Denies any recurrent strokes, TIA, or seizure disorder  Psychiatric: Anxiety; Depression; Stress  Endocrine: Denies any weight change, heat/cold intolerance, polydipsia, or polyuria  Hematologic/Immunologic : Denies any food allergies, seasonal allergies, bleeding disorders       PHYSICAL EXAMINATION    Visit Vitals  BP 106/72 (BP Site: Left arm, Patient Position: Sitting, Cuff Size: Small)   Pulse (!) 101   Ht 1.626 m (5\' 4" )   Wt 58.1 kg (128 lb)   BMI 21.97 kg/m        Constitutional: cooperative, alert, oriented, mask on   Skin: warm and dry to touch   Head: normocephalic   Eyes: conjunctivae and lids normal   ENT: Ears reveal no gross abnormalities   Neck: carotid pulses are full and  equal bilaterally, no JVD, no bruits   Chest: Normal symmetry, no tenderness to palpation, normal respiratory excursion, no use of accessory muscles,  clear to auscultation and percussion.   Cardiac: Regular rhythm, S1 normal, S2 normal, No S3 or S4, Apical impulse not displaced, no murmurs, gallops or rubs detected.   Abdomen: abdomen soft, bowel sounds normoactive, non-tender   Peripheral Pulses: pulses full and equal in all extremities      Extremities/Back: No clubbing, cyanosis or edema     Neurological: oriented to time, person and place       LABS:   No results found for: CBC  Lab Results   Component Value Date    AST 22 02/07/2020    ALT 23 02/07/2020     No results found for: LIPID  No results found for: HGBA1C, BNP, TSH    Lab Results   Component Value Date    NA 139 02/07/2020    K 4.6 02/07/2020    BUN 6 (L) 02/07/2020    CREAT 0.8 02/07/2020    CL 109 02/07/2020    CO2 21 (L) 02/07/2020    GLU 87 02/07/2020      No results found for: BNP   No results found for: CHOL  No results found for: HDL  No results found for: LDL  No results found for: TRIG   Lab Results   Component Value Date    ALT 23 02/07/2020    AST 22 02/07/2020    ALKPHOS 60 02/07/2020    BILITOTAL 0.3 02/07/2020      Lab Results   Component Value Date    WBC 5.15 02/07/2020    HGB 13.1 02/07/2020    HCT 40.3 02/07/2020    MCV 87.0 02/07/2020    PLT 301 02/07/2020        The ASCVD Risk score (Goff [place] Jr., et al., 2013) failed to calculate for the following reasons:    The 2013 ASCVD risk score is only valid for ages 61 to 1     EKG: Sinus rhythm, borderline short PR.  Heart rate 100 bpm.  Normal intervals.  No diffuse ST elevation noted      IMPRESSION:     1. Clinical history most suggestive for acute pericarditis, now in a low level intensity  2. COVID infection April 2022  3. Diagnosed with pericarditis, was on colchicine and tramadol, currently on ibuprofen  4. History of gastritis, unclear if related to the colchicine or the Naprosyn  or both  5. Generalized anxiety disorder  6. CT chest April 2022: No PE, no aortic aneurysm, normal heart size, no pericardial effusion.      RECOMMENDATIONS:  1. The patient does describe pericarditis symptoms.  I think the most optimal treatment for her will be colchicine.  We will retrial colchicine a 0.6 mg once a day.  I would like her to complete a 2-month course even if her symptoms resolve  2. Obtain 2D echocardiogram to objectively assess her LV systolic function as well as her pericardium  3. Obtain inflammatory markers including ESR and CRP to guide level of inflammation  4. We will see her back with APP in a month to 6 weeks  5. She may continue activities as tolerated                                                 Orders Placed This Encounter   Procedures   . High sensitivity CRP   . Sedimentation rate (ESR)   . ECG 12 lead (Normal)   . Echocardiogram Adult Complete W Clr/ Dopp Waveform   . APP Office Visit ([facility])       Orders Placed This Encounter   Medications   . colchicine 0.6 MG tablet     Sig: Take 1 tablet (0.6 mg total) by mouth daily     Dispense:  90 tablet     Refill:  1         SIGNED:    Jari Pigg, MD  [facility]     CC: Rivka Safer, MD    This note was generated by the Dragon speech recognition and may contain errors or omissions not intended by the user. Grammatical errors, random word insertions, deletions, pronoun errors, and incomplete sentences are occasional consequences of this technology due to software limitations. Not all errors are caught or corrected. If there are questions or concerns about the content of this note or information contained within the body of this dictation, they should be addressed directly with the author for clarification.

## 2021-02-12 ENCOUNTER — Ambulatory Visit (INDEPENDENT_AMBULATORY_CARE_PROVIDER_SITE_OTHER): Payer: Commercial Managed Care - POS | Admitting: Cardiology

## 2021-02-17 ENCOUNTER — Other Ambulatory Visit (INDEPENDENT_AMBULATORY_CARE_PROVIDER_SITE_OTHER): Payer: Self-pay | Admitting: Family Medicine

## 2021-02-17 DIAGNOSIS — F411 Generalized anxiety disorder: Secondary | ICD-10-CM

## 2021-03-05 ENCOUNTER — Ambulatory Visit: Payer: Commercial Managed Care - POS

## 2021-03-11 ENCOUNTER — Ambulatory Visit (FREE_STANDING_LABORATORY_FACILITY): Payer: Commercial Managed Care - POS | Admitting: Family Medicine

## 2021-03-11 ENCOUNTER — Encounter (INDEPENDENT_AMBULATORY_CARE_PROVIDER_SITE_OTHER): Payer: Self-pay

## 2021-03-11 ENCOUNTER — Encounter (INDEPENDENT_AMBULATORY_CARE_PROVIDER_SITE_OTHER): Payer: Self-pay | Admitting: Family Medicine

## 2021-03-11 VITALS — BP 116/68 | HR 86 | Temp 98.4°F | Resp 20 | Wt 132.0 lb

## 2021-03-11 DIAGNOSIS — K5901 Slow transit constipation: Secondary | ICD-10-CM

## 2021-03-11 DIAGNOSIS — Z23 Encounter for immunization: Secondary | ICD-10-CM

## 2021-03-11 DIAGNOSIS — F411 Generalized anxiety disorder: Secondary | ICD-10-CM

## 2021-03-11 DIAGNOSIS — G43009 Migraine without aura, not intractable, without status migrainosus: Secondary | ICD-10-CM

## 2021-03-11 LAB — CBC AND DIFFERENTIAL
Absolute NRBC: 0 10*3/uL (ref 0.00–0.00)
Basophils Absolute Automated: 0.06 10*3/uL (ref 0.00–0.08)
Basophils Automated: 1.2 %
Eosinophils Absolute Automated: 0.06 10*3/uL (ref 0.00–0.44)
Eosinophils Automated: 1.2 %
Hematocrit: 45.7 % — ABNORMAL HIGH (ref 34.7–43.7)
Hgb: 14.1 g/dL (ref 11.4–14.8)
Immature Granulocytes Absolute: 0.01 10*3/uL (ref 0.00–0.07)
Immature Granulocytes: 0.2 %
Lymphocytes Absolute Automated: 1.82 10*3/uL (ref 0.42–3.22)
Lymphocytes Automated: 36.3 %
MCH: 29 pg (ref 25.1–33.5)
MCHC: 30.9 g/dL — ABNORMAL LOW (ref 31.5–35.8)
MCV: 94 fL (ref 78.0–96.0)
MPV: 11.3 fL (ref 8.9–12.5)
Monocytes Absolute Automated: 0.39 10*3/uL (ref 0.21–0.85)
Monocytes: 7.8 %
Neutrophils Absolute: 2.67 10*3/uL (ref 1.10–6.33)
Neutrophils: 53.3 %
Nucleated RBC: 0 /100 WBC (ref 0.0–0.0)
Platelets: 279 10*3/uL (ref 142–346)
RBC: 4.86 10*6/uL (ref 3.90–5.10)
RDW: 13 % (ref 11–15)
WBC: 5.01 10*3/uL (ref 3.10–9.50)

## 2021-03-11 LAB — COMPREHENSIVE METABOLIC PANEL
ALT: 11 U/L (ref 0–55)
AST (SGOT): 15 U/L (ref 5–34)
Albumin/Globulin Ratio: 2 (ref 0.9–2.2)
Albumin: 4.3 g/dL (ref 3.5–5.0)
Alkaline Phosphatase: 75 U/L (ref 37–117)
Anion Gap: 7 (ref 5.0–15.0)
BUN: 6 mg/dL — ABNORMAL LOW (ref 7.0–19.0)
Bilirubin, Total: 0.5 mg/dL (ref 0.2–1.2)
CO2: 25 mEq/L (ref 21–29)
Calcium: 9.6 mg/dL (ref 8.5–10.5)
Chloride: 107 mEq/L (ref 100–111)
Creatinine: 0.7 mg/dL (ref 0.4–1.5)
Globulin: 2.2 g/dL (ref 2.0–3.6)
Glucose: 85 mg/dL (ref 70–100)
Potassium: 4.7 mEq/L (ref 3.5–5.1)
Protein, Total: 6.5 g/dL (ref 6.0–8.3)
Sodium: 139 mEq/L (ref 136–145)

## 2021-03-11 LAB — HEMOLYSIS INDEX: Hemolysis Index: 12 Index (ref 0–24)

## 2021-03-11 LAB — VITAMIN D,25 OH,TOTAL: Vitamin D, 25 OH, Total: 24 ng/mL — ABNORMAL LOW (ref 30–100)

## 2021-03-11 LAB — TSH: TSH: 1.51 u[IU]/mL (ref 0.35–4.94)

## 2021-03-11 LAB — GFR: EGFR: >60

## 2021-03-11 MED ORDER — LINACLOTIDE 145 MCG PO CAPS
145.00 ug | ORAL_CAPSULE | Freq: Every day | ORAL | 5 refills | Status: AC
Start: 2021-03-11 — End: 2021-04-10

## 2021-03-11 MED ORDER — SUMATRIPTAN SUCCINATE 100 MG PO TABS
100.0000 mg | ORAL_TABLET | ORAL | 5 refills | Status: DC | PRN
Start: 2021-03-11 — End: 2021-05-11

## 2021-03-11 MED ORDER — AMITRIPTYLINE HCL 25 MG PO TABS
25.0000 mg | ORAL_TABLET | Freq: Every evening | ORAL | 3 refills | Status: DC
Start: 2021-03-11 — End: 2021-05-11

## 2021-03-11 NOTE — Progress Notes (Signed)
HA for couple of weeks, behind eyes, causing some dizziness, taking OTC Aleve - no relief  Constipation x 3 weeks- abd bloating ,OTC-Miralax, Enema and Suppository with little results

## 2021-03-11 NOTE — Progress Notes (Signed)
Subjective:      Patient ID: Christina [name] is a 25 y.o. female     Chief Complaint   Patient presents with    Anxiety    Headache    Constipation        HA for couple of weeks, behind eyes, causing some dizziness, taking OTC Aleve - no relief  Constipation x 3 weeks-OTC-Miralax, Enema and Suppository with little results     Always tends toward constipation - trying several thing.  Tried linzess.    Saw Gi in the past.    Headaches - behind the eyes.  Standing dizzy.  Very sound sensitive.  Daily - waking up with the   Taking alieve - not helping.  Mother has migraines.    Anxiety - tendency for this    Seeing cardiology - pericarditis.    Sertraline at 200mg  3-4 seems to be helpful     The following sections were reviewed this encounter by the provider:   Tobacco  Allergies  Meds  Problems  Med Hx  Surg Hx  Fam Hx         Review of Systems   Constitutional:  Negative for activity change, appetite change, chills, fever and unexpected weight change.   HENT:  Negative for congestion, ear pain, hearing loss, sinus pain, sore throat, trouble swallowing and voice change.    Eyes:  Negative for visual disturbance.   Respiratory:  Negative for cough and shortness of breath.    Cardiovascular:  Negative for chest pain, palpitations and leg swelling.   Gastrointestinal:  Positive for constipation. Negative for abdominal pain and diarrhea.   Musculoskeletal:  Negative for arthralgias and joint swelling.   Neurological:  Positive for headaches. Negative for syncope and weakness.   Psychiatric/Behavioral:  The patient is not nervous/anxious.         BP 116/68 (BP Site: Left arm, Patient Position: Sitting, Cuff Size: Medium)   Pulse 86   Temp 98.4 F (36.9 C) (Temporal)   Resp 20   Wt 59.9 kg (132 lb)   BMI 22.66 kg/m     Objective:     Physical Exam  Constitutional:       Appearance: Normal appearance. She is normal weight.   HENT:      Head: Normocephalic and atraumatic.      Right Ear: Tympanic membrane, ear  canal and external ear normal.      Left Ear: Tympanic membrane, ear canal and external ear normal.      Nose: Nose normal.      Mouth/Throat:      Mouth: Mucous membranes are moist.   Eyes:      General: No scleral icterus.     Extraocular Movements: Extraocular movements intact.      Pupils: Pupils are equal, round, and reactive to light.   Cardiovascular:      Rate and Rhythm: Normal rate and regular rhythm.      Pulses: Normal pulses.      Heart sounds: Normal heart sounds.   Pulmonary:      Effort: Pulmonary effort is normal.      Breath sounds: Normal breath sounds.   Abdominal:      General: Abdomen is flat. Bowel sounds are normal.      Palpations: Abdomen is soft.   Musculoskeletal:         General: Normal range of motion.      Cervical back: Normal range of motion and neck supple.  Skin:     General: Skin is warm.   Neurological:      General: No focal deficit present.      Mental Status: She is alert and oriented to person, place, and time.   Psychiatric:         Mood and Affect: Mood normal.        Assessment:     1. Slow transit constipation  - linaclotide (Linzess) 145 MCG Cap capsule; Take 1 capsule (145 mcg total) by mouth daily  Dispense: 30 capsule; Refill: 5  - CBC and differential  - Comprehensive metabolic panel  - TSH    2. Migraine without aura and without status migrainosus, not intractable  - amitriptyline (ELAVIL) 25 MG tablet; Take 1 tablet (25 mg total) by mouth nightly  Dispense: 30 tablet; Refill: 3  - SUMAtriptan (Imitrex) 100 MG tablet; Take 1 tablet (100 mg total) by mouth every 2 (two) hours as needed for Migraine (MIgraine) Take at onset of migraine;  May repeat in 2 hrs - max dosing 200mg /24 hrs  Dispense: 9 tablet; Refill: 5  - Ambulatory referral to Gastroenterology    3. GAD (generalized anxiety disorder)  - CBC and differential  - Comprehensive metabolic panel  - TSH  - Vitamin D,25 OH, Total    4. Immunization due  - Tdap vaccine greater than or equal to 7yo IM        Plan:      Pt reports she had the HPV vaccine - encourage her to work with mom to get vaccine records to get up to date in out file  Tdap booster today    Carlena Sax, MD

## 2021-03-13 ENCOUNTER — Encounter (INDEPENDENT_AMBULATORY_CARE_PROVIDER_SITE_OTHER): Payer: Self-pay | Admitting: Family Medicine

## 2021-03-14 ENCOUNTER — Telehealth (INDEPENDENT_AMBULATORY_CARE_PROVIDER_SITE_OTHER): Payer: Commercial Managed Care - POS | Admitting: Family Medicine

## 2021-03-15 NOTE — Progress Notes (Signed)
Overall labs look very good.  The Vit D is a little low - I would recommend adding in some over the counter VIt D 1000IU daily  ALso goal to get outside for a walk for 10-15 minutes daily as we get VIt D from the sun.    VIt D is in dairy - so consider a glass of milk or yogurt daily.  There is also some in eggs and can be found in Salmon and tuna.

## 2021-03-16 NOTE — Progress Notes (Signed)
Patient called in today reporting constipation, Feels that there is a lump blocking solid matter from passing through her anus. Last stool with solid matter was Friday. Since then has been clearing liquidy stool but sensation and urge to defecate are still present. Some mild pain, mostly bloated.     Advised patient to go to ER as they would be be able to diagnose and treat the lump ASAP and I did not want patient to delay with treatment as she reported that she has been dealing with constipation for the past few weeks.

## 2021-03-17 NOTE — Progress Notes (Signed)
Ok per signed [employer] form.  Detailed voicemail left on CP/HP relaying above message.  Patient instructed to call back if any questions.

## 2021-03-23 ENCOUNTER — Encounter (INDEPENDENT_AMBULATORY_CARE_PROVIDER_SITE_OTHER): Payer: Self-pay | Admitting: Family Medicine

## 2021-03-24 ENCOUNTER — Encounter (INDEPENDENT_AMBULATORY_CARE_PROVIDER_SITE_OTHER): Payer: Commercial Managed Care - POS

## 2021-04-01 ENCOUNTER — Encounter (INDEPENDENT_AMBULATORY_CARE_PROVIDER_SITE_OTHER): Payer: Self-pay | Admitting: Family Medicine

## 2021-04-02 ENCOUNTER — Other Ambulatory Visit (INDEPENDENT_AMBULATORY_CARE_PROVIDER_SITE_OTHER): Payer: Self-pay | Admitting: Family Medicine

## 2021-04-02 DIAGNOSIS — G43009 Migraine without aura, not intractable, without status migrainosus: Secondary | ICD-10-CM

## 2021-04-02 NOTE — Telephone Encounter (Signed)
Last filled 03/11/21 #30 x3. Should have refill. Pharmacy notified.

## 2021-04-13 ENCOUNTER — Encounter (INDEPENDENT_AMBULATORY_CARE_PROVIDER_SITE_OTHER): Payer: Self-pay | Admitting: Family Medicine

## 2021-04-22 ENCOUNTER — Other Ambulatory Visit (INDEPENDENT_AMBULATORY_CARE_PROVIDER_SITE_OTHER): Payer: Self-pay | Admitting: Family Medicine

## 2021-04-22 DIAGNOSIS — F411 Generalized anxiety disorder: Secondary | ICD-10-CM

## 2021-05-11 ENCOUNTER — Encounter (INDEPENDENT_AMBULATORY_CARE_PROVIDER_SITE_OTHER): Payer: Self-pay | Admitting: Family Medicine

## 2021-05-11 ENCOUNTER — Telehealth (INDEPENDENT_AMBULATORY_CARE_PROVIDER_SITE_OTHER): Payer: Commercial Managed Care - POS | Admitting: Family Medicine

## 2021-05-11 DIAGNOSIS — F331 Major depressive disorder, recurrent, moderate: Secondary | ICD-10-CM

## 2021-05-11 DIAGNOSIS — F411 Generalized anxiety disorder: Secondary | ICD-10-CM

## 2021-05-11 MED ORDER — BUSPIRONE HCL 10 MG PO TABS
20.0000 mg | ORAL_TABLET | Freq: Two times a day (BID) | ORAL | 3 refills | Status: DC
Start: 2021-05-11 — End: 2021-06-15

## 2021-05-11 MED ORDER — ARIPIPRAZOLE 5 MG PO TABS
5.0000 mg | ORAL_TABLET | Freq: Every day | ORAL | 1 refills | Status: DC
Start: 2021-05-11 — End: 2021-06-15

## 2021-05-11 NOTE — Progress Notes (Signed)
Subjective:      Patient ID: Christina [name] is a 25 y.o. female     Chief Complaint   Patient presents with    Depression    Verbal consent has been obtained from the patient to conduct visit and bill insurance for this video visit encounter, performed to minimize exposure to COVID-19 during public emergency. Patient was positively identified visually and was verified to be located in the state of [place] at the time of the visit.      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  Anxious all the time    Buspar 10mg  BID  And propranolol 40mg  at bedtime  And not sleeping    Sertraline 200mg  for about 4 months  Feels like the depression is worse -- in past had been on abilify for a bit -- this really helped her    Working with a therapist - once a week     The following sections were reviewed this encounter by the provider:        Review of Systems   Psychiatric/Behavioral:  Positive for decreased concentration and sleep disturbance. Negative for confusion and suicidal ideas. The patient is nervous/anxious.         Depression        There were no vitals taken for this visit.    Objective:     Physical Exam  Constitutional:       Appearance: Normal appearance.   HENT:      Head: Normocephalic and atraumatic.      Nose: Nose normal.   Pulmonary:      Effort: Pulmonary effort is normal.   Musculoskeletal:      Cervical back: Normal range of motion and neck supple.   Neurological:      General: No focal deficit present.      Mental Status: She is alert.   Psychiatric:         Mood and Affect: Mood normal.         Behavior: Behavior normal.         Thought Content: Thought content normal.        Assessment:     1. GAD (generalized anxiety disorder)  - busPIRone (BUSPAR) 10 MG tablet; Take 2 tablets (20 mg total) by mouth 2 (two) times daily  Dispense: 120 tablet; Refill: 3    2. Moderate episode of recurrent major depressive disorder  -  ARIPiprazole (ABILIFY) 5 MG tablet; Take 1 tablet (5 mg total) by mouth daily  Dispense: 30 tablet; Refill: 1      Plan:     Add Abilfy    Increase buspar to 20mg  BID    F/u in a month  Carlena Sax, MD

## 2021-05-12 ENCOUNTER — Telehealth (INDEPENDENT_AMBULATORY_CARE_PROVIDER_SITE_OTHER): Payer: Self-pay | Admitting: Family Medicine

## 2021-05-12 NOTE — Telephone Encounter (Signed)
Spoke with pt and advised last Rx for Zoloft sent on 01/26/21 # 60 with 5 refills, should have available refills at pharmacy. Will check with pharmacy, advised to contact office if Rx no available and will send a bridge until FU VV on 06/05/21

## 2021-05-12 NOTE — Telephone Encounter (Signed)
Caller: Patient   Rx Refill Request: Zoloft   Days left of Rx: 0  Pharmacy:[facility] [id number] [place]    Patient's [place] / upcoming] OV: Last seen: 05/12/21  Caller's preferred [phone]  Patient available on mychart: YES   Please note: Pt states she forgot to ask for a refill of this medication at her appt yesterday.

## 2021-05-14 ENCOUNTER — Encounter (INDEPENDENT_AMBULATORY_CARE_PROVIDER_SITE_OTHER): Payer: Self-pay | Admitting: Family Medicine

## 2021-05-14 ENCOUNTER — Telehealth (INDEPENDENT_AMBULATORY_CARE_PROVIDER_SITE_OTHER): Payer: Commercial Managed Care - POS | Admitting: Family Medicine

## 2021-05-14 VITALS — Temp 96.7°F | Ht 64.0 in | Wt 132.0 lb

## 2021-05-14 DIAGNOSIS — J0101 Acute recurrent maxillary sinusitis: Secondary | ICD-10-CM

## 2021-05-14 MED ORDER — AMOXICILLIN-POT CLAVULANATE 875-125 MG PO TABS
1.00 | ORAL_TABLET | Freq: Two times a day (BID) | ORAL | 0 refills | Status: AC
Start: 2021-05-14 — End: 2021-05-24

## 2021-05-14 NOTE — Progress Notes (Signed)
Subjective:      Patient ID: Christina [name] is a 25 y.o. female     Chief Complaint   Patient presents with    Sinusitis        HPI     Verbal consent has been obtained from the patient to conduct visit and bill insurance for this video visit encounter, performed to minimize exposure to COVID-19 during public emergency. Patient was positively identified visually and was verified to be located in the state of [place] at the time of the visit.     "I'm pretty sure I have a sinus infection". Covid neg this am.     Pressure across her cheeks and in her forehead. Has a deviated septum.     Started about a week ago - gradual worsening over the week - slight congestion in her nose and then the pressure bfuilds each day.     No ear sx other than pressure. No sore throat. Does have some irritating post nasal drip that doesn't taste good. No cough,  no t short  of breath. No GI sx . Smell and taste are intact.     Last time she was treated for an antibiotic was maybe April. Had COVID in June. She developed a pericarditis from the COVID and has been on colchicine for that.        The following sections were reviewed this encounter by the provider:        Review of Systems     See HPI      Temp (!) 96.7 F (35.9 C) (Oral)   Ht 1.626 m (5\' 4" )   Wt 59.9 kg (132 lb)   BMI 22.66 kg/m     Objective:     Physical Exam  Constitutional:       Appearance: Normal appearance.   HENT:      Head: Atraumatic.      Nose: Congestion present.   Eyes:      Conjunctiva/sclera: Conjunctivae normal.   Pulmonary:      Effort: Pulmonary effort is normal. No tachypnea or respiratory distress.      Comments: No cough with deep inspiration  Musculoskeletal:      Cervical back: Normal range of motion.   Neurological:      Mental Status: She is alert.        Assessment:     1. Acute recurrent maxillary sinusitis  - amoxicillin-clavulanate (AUGMENTIN) 875-125 MG per tablet; Take 1 tablet by mouth 2 (two) times daily for 10 days  Dispense: 20 tablet;  Refill: 0      Plan:     Take antibiotic with breakfast and dinner; in the middle of the day, eat a yogurt or take a probiotic to help restore normal intestinal flora.     Supportive care discussed. Plan to follow up if symptoms are not improving/resolving in the next 3-5 days.      Docia Furl, MD

## 2021-06-03 ENCOUNTER — Other Ambulatory Visit (INDEPENDENT_AMBULATORY_CARE_PROVIDER_SITE_OTHER): Payer: Self-pay | Admitting: Family Medicine

## 2021-06-03 DIAGNOSIS — F411 Generalized anxiety disorder: Secondary | ICD-10-CM

## 2021-06-03 DIAGNOSIS — F331 Major depressive disorder, recurrent, moderate: Secondary | ICD-10-CM

## 2021-06-03 NOTE — Telephone Encounter (Signed)
Pt last seen for anxiety 05/11/21  Abilify added and pt advised to RTO in Oct  No upcoming OV

## 2021-06-05 ENCOUNTER — Telehealth (INDEPENDENT_AMBULATORY_CARE_PROVIDER_SITE_OTHER): Payer: Commercial Managed Care - POS | Admitting: Family Medicine

## 2021-06-15 ENCOUNTER — Encounter (INDEPENDENT_AMBULATORY_CARE_PROVIDER_SITE_OTHER): Payer: Self-pay | Admitting: Family Medicine

## 2021-06-15 ENCOUNTER — Telehealth (INDEPENDENT_AMBULATORY_CARE_PROVIDER_SITE_OTHER): Payer: Commercial Managed Care - POS | Admitting: Family Medicine

## 2021-06-15 DIAGNOSIS — F331 Major depressive disorder, recurrent, moderate: Secondary | ICD-10-CM

## 2021-06-15 DIAGNOSIS — F411 Generalized anxiety disorder: Secondary | ICD-10-CM

## 2021-06-15 DIAGNOSIS — F320 Major depressive disorder, single episode, mild: Secondary | ICD-10-CM

## 2021-06-15 MED ORDER — LORAZEPAM 0.5 MG PO TABS
0.5000 mg | ORAL_TABLET | Freq: Three times a day (TID) | ORAL | 1 refills | Status: DC | PRN
Start: 2021-06-15 — End: 2021-07-15

## 2021-06-15 MED ORDER — ARIPIPRAZOLE 5 MG PO TABS
5.0000 mg | ORAL_TABLET | Freq: Every day | ORAL | 1 refills | Status: DC
Start: 2021-06-15 — End: 2021-07-18

## 2021-06-15 MED ORDER — BUSPIRONE HCL 30 MG PO TABS
30.0000 mg | ORAL_TABLET | Freq: Two times a day (BID) | ORAL | 5 refills | Status: DC
Start: 2021-06-15 — End: 2021-11-28

## 2021-06-15 NOTE — Progress Notes (Signed)
Subjective:      Patient ID: Christina [name] is a 25 y.o. female     Chief Complaint   Patient presents with    Depression    Verbal consent has been obtained from the patient to conduct visit and bill insurance for this video visit encounter, performed to minimize exposure to COVID-19 during public emergency. Patient was positively identified visually and was verified to be located in the state of [place] at the time of the visit.      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  Anxious all the time    Buspar 10mg  BID  And propranolol 40mg  at bedtime  And not sleeping well    Sertraline 200mg  for about 4 months  Feels like the depression is worse -- in past had been on abilify for a bit -- this really helped her.  The abilify 5mg  as added and helping.    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  Class work is hard.  Able to keep up.       The following sections were reviewed this encounter by the provider:   Tobacco   Allergies   Meds   Problems   Med Hx   Surg Hx   Fam Hx          Review of Systems   Psychiatric/Behavioral:  Positive for decreased concentration and sleep disturbance. Negative for confusion and suicidal ideas. The patient is nervous/anxious.         Depression        There were no vitals taken for this visit.    Objective:     Physical Exam  Constitutional:       Appearance: Normal appearance.   HENT:      Head: Normocephalic and atraumatic.      Nose: Nose normal.   Pulmonary:      Effort: Pulmonary effort is normal.   Musculoskeletal:      Cervical back: Normal range of motion and neck supple.   Neurological:      General: No focal deficit present.      Mental Status: She is alert.   Psychiatric:         Mood and Affect: Mood normal.         Behavior: Behavior normal.         Thought Content: Thought content normal.        Assessment:     1. Current mild episode of major  depressive disorder without prior episode    2. GAD (generalized anxiety disorder)  - busPIRone (BUSPAR) 30 MG tablet; Take 1 tablet (30 mg total) by mouth 2 (two) times daily  Dispense: 60 tablet; Refill: 5  - LORazepam (ATIVAN) 0.5 MG tablet; Take 1 tablet (0.5 mg total) by mouth every 8 (eight) hours as needed for Anxiety  Dispense: 20 tablet; Refill: 1    3. Moderate episode of recurrent major depressive disorder  - ARIPiprazole (ABILIFY) 5 MG tablet; Take 1 tablet (5 mg total) by mouth daily  Dispense: 30 tablet; Refill: 1      Plan:     Add Abilfy helped    Increase buspar to 30mg  BID  Add prn lorazepam - has helped int he past.    F/u in a month  Seeing therapist once a week - good support    Carlena Sax, MD

## 2021-07-07 ENCOUNTER — Other Ambulatory Visit (INDEPENDENT_AMBULATORY_CARE_PROVIDER_SITE_OTHER): Payer: Self-pay | Admitting: Family Medicine

## 2021-07-07 DIAGNOSIS — F411 Generalized anxiety disorder: Secondary | ICD-10-CM

## 2021-07-07 DIAGNOSIS — F331 Major depressive disorder, recurrent, moderate: Secondary | ICD-10-CM

## 2021-07-07 NOTE — Telephone Encounter (Signed)
Buspar last filled 06/15/32 #60 x5  Abilify last filled 06/15/21 #30 x1  Advised to RTO 1 month  No upcoming OV

## 2021-07-18 ENCOUNTER — Other Ambulatory Visit (INDEPENDENT_AMBULATORY_CARE_PROVIDER_SITE_OTHER): Payer: Self-pay | Admitting: Family Medicine

## 2021-07-18 DIAGNOSIS — F331 Major depressive disorder, recurrent, moderate: Secondary | ICD-10-CM

## 2021-10-05 ENCOUNTER — Telehealth (INDEPENDENT_AMBULATORY_CARE_PROVIDER_SITE_OTHER): Payer: Commercial Managed Care - POS | Admitting: Family Medicine

## 2021-10-05 ENCOUNTER — Encounter (INDEPENDENT_AMBULATORY_CARE_PROVIDER_SITE_OTHER): Payer: Self-pay | Admitting: Family Medicine

## 2021-10-05 VITALS — Temp 97.0°F | Wt 135.0 lb

## 2021-10-05 DIAGNOSIS — U071 COVID-19: Secondary | ICD-10-CM

## 2021-10-05 DIAGNOSIS — R0789 Other chest pain: Secondary | ICD-10-CM

## 2021-10-05 DIAGNOSIS — Z8679 Personal history of other diseases of the circulatory system: Secondary | ICD-10-CM

## 2021-10-05 MED ORDER — NIRMATRELVIR&RITONAVIR 300/100 20 X 150 MG & 10 X 100MG PO TBPK
3.0000 | ORAL_TABLET | Freq: Two times a day (BID) | ORAL | 0 refills | Status: AC
Start: 2021-10-05 — End: 2021-10-10

## 2021-10-05 NOTE — Progress Notes (Signed)
[facility]                       Date of Virtual Visit: 10/05/2021 2:31 PM        Patient ID: Christina [name] is a 26 y.o. female.  Attending Physician: Adrian Blackwater Haziel Molner, MD       Telemedicine Eligibility:    State Location:  [x]  [place]  []  [place]  []  [place] []  [place]  []  Other:    Physical Location:  [x]  Home  []         []        []          []  Other:    Patient Identity Verification:  [x]  State Issued ID  []  Insurance Eligibility Check  []  Other:    Physical Address Verification: (for 911)  [x]  Yes  []  No    Personal identity shared with patient:  [x]  Yes  []  No    Education on nature of video visit shared with patient:  [x]  Yes  []  No    Emergency plan agreed upon with patient:  [x]  Yes  []  No    If the patient had not had this virtual visit, what would they have done?  []         []         []        []          []  Other:    Visit terminated since not appropriate for virtual care:  [x]  N/A  []  Reason:    Verbal consent has been obtained from the patient to contact and bill insurance for this video visit to minimize exposure to COVID-19.  Pt was positively identified and location verified at time of the visit.          Chief Complaint:    Chief Complaint   Patient presents with    Cough               HPI:    Sx started 2 days ago with severe exhaustion, congestion, sinus h/a, cough with specks of blood in green phlegm, SOB, chest pain.   Had COVID 3/22 with pericarditis treated with colchicine, went to ER 3 times but was never admitted..    Cough  Associated symptoms include chest pain and shortness of breath. Pertinent negatives include no chills, ear pain, fever, postnasal drip, rhinorrhea, sore throat or wheezing.           Problem List:    Patient Active Problem List   Diagnosis    Slow transit constipation    Concussion without loss of consciousness, initial encounter    Irregular menstruation    Heavy menstrual period             Current Meds:     Outpatient Medications Marked as Taking for the 10/05/21 encounter (Telemedicine Visit) with Elzie Knisley, Adrian Blackwater, MD   Medication Sig Dispense Refill    ARIPiprazole (ABILIFY) 5 MG tablet TAKE 1 TABLET (5 MG TOTAL) BY MOUTH DAILY. 90 tablet 0    busPIRone (BUSPAR) 30 MG tablet Take 1 tablet (30 mg total) by mouth 2 (two) times daily 60 tablet 5    levonorgestrel (Mirena, 52 MG,) 20 MCG/24HR IUD Place vaginally once      propranolol (INDERAL) 20 MG tablet TAKE 1 TABLET (20 MG TOTAL) BY MOUTH 3 (THREE) TIMES DAILY AS NEEDED (ANXIETY) 270 tablet 0  Allergies:    No Known Allergies          Past Surgical History:    Past Surgical History:   Procedure Laterality Date    CHOLECYSTECTOMY  2013    RHINOPLASTY  2020    SEPTOPLASTY  2020    TONSILLECTOMY      2007           Family History:    Family History   Problem Relation Age of Onset    Hypertension Mother     Hypertension Father     Heart failure Maternal Grandfather     Cancer Paternal Grandmother     Cancer Paternal Grandfather            Social History:    Social History     Tobacco Use    Smoking status: Never    Smokeless tobacco: Never   Vaping Use    Vaping Use: Never used   Substance Use Topics    Alcohol use: Yes     Comment: occasionally    Drug use: No           The following sections were reviewed this encounter by the provider:   Tobacco  Allergies  Meds  Problems  Med Hx  Surg Hx  Fam Hx             Vital Signs:    Temp 97 F (36.1 C) (Oral)   Wt 61.2 kg (135 lb)   LMP  (LMP Unknown)   BMI 23.17 kg/m          ROS:    Review of Systems   Constitutional:  Positive for fatigue. Negative for chills and fever.        No sweats   HENT:  Negative for congestion, ear pain, postnasal drip, rhinorrhea, sinus pain and sore throat.    Respiratory:  Positive for cough and shortness of breath. Negative for wheezing.    Cardiovascular:  Positive for chest pain.            Physical Exam:    Physical Exam   GENERAL APPEARANCE: alert, in no acute distress,  pleasant, well nourished.   HEAD: normal appearance, atraumatic  EYES: eyelids normal appearance  Vision: extraocular movements: right eye normal motility, left eye normal motility, normal vision  EARS: normal hearing  Mouth: normal appearance  Lungs/Chest: respiratory effort: unlabored and normal respiratory rate  Neurologic: Mental status: oriented to person and problem.  Expressive language: normal comprehension and speech, CN's grossly intact  PSYCH: appropriate affect, appropriate mood, normal speech, normal attention  Mental Status exam: Behavior: cooperative.  Thought processes: normal attention, normal comprehension.  Cognition: alert        Assessment:    1. COVID-19 virus infection  - nirmatrelvir-ritonavir (PAXLOVID) 20 x 150 MG & 10 x 100MG  dose pack(emergency use authorization); Take 3 tablets by mouth 2 (two) times daily for 5 days The dosage for PAXLOVID is 300 mg nirmatrelvir (two 150 mg tablets) with 100 mg ritonavir (one 100 mg tablet) with all three tablets taken together.  Dispense: 30 tablet; Refill: 0    2. Other chest pain  - Cardiology Referral: Jari Pigg, MD [facility] [place])    3. History of pericarditis  - Cardiology Referral: Jari Pigg, MD ( [facility])            Plan:    Tx with paxlovid due to pericarditis that developed with last episode of  COVID  Supportive care, decongestants, nasal saline rinses, OTC pain medications PRN  Current CDC guidelines discussed.              Follow-up:    Return if symptoms worsen or fail to improve.         Adrian Blackwater Eun Vermeer, MD

## 2021-10-05 NOTE — Progress Notes (Signed)
Patient is in [place] during current appointment.    Patient has been sick for 2-3 days.  With cough, nasal/chest congestion, body aches, and chest pain.  Denies any fever, n/v, and diarrhea.  Took Sudafed, Theraflu, and OTC cough syrup.  Home covid test today, positive.

## 2021-11-03 ENCOUNTER — Other Ambulatory Visit (INDEPENDENT_AMBULATORY_CARE_PROVIDER_SITE_OTHER): Payer: Self-pay | Admitting: Family Medicine

## 2021-11-03 ENCOUNTER — Encounter (INDEPENDENT_AMBULATORY_CARE_PROVIDER_SITE_OTHER): Payer: Self-pay | Admitting: Family Medicine

## 2021-11-03 DIAGNOSIS — F331 Major depressive disorder, recurrent, moderate: Secondary | ICD-10-CM

## 2021-11-03 DIAGNOSIS — F411 Generalized anxiety disorder: Secondary | ICD-10-CM

## 2021-11-03 MED ORDER — SERTRALINE HCL 100 MG PO TABS
200.0000 mg | ORAL_TABLET | Freq: Every day | ORAL | 1 refills | Status: DC
Start: 2021-11-03 — End: 2021-12-15

## 2021-11-03 NOTE — Telephone Encounter (Signed)
VV scheduled 12/11/21 requesting a Bridge Rx

## 2021-11-03 NOTE — Telephone Encounter (Signed)
Last appt VV 06/05/21, 1 mo FU OV  Last Rx 07/18/21 # 90 with 0 refills  Scheduled a FU VV  on 12/09/21. Requesting a bridge until appt.   Stated has enough of Abilify but needs refill on Zoloft

## 2021-11-10 MED ORDER — ARIPIPRAZOLE 5 MG PO TABS
5.0000 mg | ORAL_TABLET | Freq: Every day | ORAL | 0 refills | Status: DC
Start: 2021-11-10 — End: 2021-12-15

## 2021-11-27 ENCOUNTER — Other Ambulatory Visit (INDEPENDENT_AMBULATORY_CARE_PROVIDER_SITE_OTHER): Payer: Self-pay | Admitting: Family Medicine

## 2021-11-27 DIAGNOSIS — F411 Generalized anxiety disorder: Secondary | ICD-10-CM

## 2021-11-27 NOTE — Telephone Encounter (Signed)
Last seen on 06/15/21. Upcoming appt on 12/11/21.  Last rx on 06/15/21 #60, 5 refills.

## 2021-12-11 ENCOUNTER — Encounter (INDEPENDENT_AMBULATORY_CARE_PROVIDER_SITE_OTHER): Payer: Commercial Managed Care - POS | Admitting: Family Medicine

## 2021-12-11 ENCOUNTER — Encounter (INDEPENDENT_AMBULATORY_CARE_PROVIDER_SITE_OTHER): Payer: Self-pay | Admitting: Family Medicine

## 2021-12-15 ENCOUNTER — Telehealth (INDEPENDENT_AMBULATORY_CARE_PROVIDER_SITE_OTHER): Payer: Commercial Managed Care - POS | Admitting: Family Medicine

## 2021-12-15 ENCOUNTER — Encounter (INDEPENDENT_AMBULATORY_CARE_PROVIDER_SITE_OTHER): Payer: Self-pay | Admitting: Family Medicine

## 2021-12-15 DIAGNOSIS — Z975 Presence of (intrauterine) contraceptive device: Secondary | ICD-10-CM

## 2021-12-15 DIAGNOSIS — F411 Generalized anxiety disorder: Secondary | ICD-10-CM

## 2021-12-15 DIAGNOSIS — F331 Major depressive disorder, recurrent, moderate: Secondary | ICD-10-CM

## 2021-12-15 MED ORDER — ARIPIPRAZOLE 5 MG PO TABS
5.0000 mg | ORAL_TABLET | Freq: Every day | ORAL | 1 refills | Status: DC
Start: 2021-12-15 — End: 2022-05-17

## 2021-12-15 MED ORDER — SERTRALINE HCL 100 MG PO TABS
200.0000 mg | ORAL_TABLET | Freq: Every day | ORAL | 1 refills | Status: DC
Start: 2021-12-15 — End: 2022-05-17

## 2021-12-15 MED ORDER — BUSPIRONE HCL 30 MG PO TABS
30.0000 mg | ORAL_TABLET | Freq: Every day | ORAL | 1 refills | Status: DC
Start: 2021-12-15 — End: 2022-05-17

## 2021-12-15 NOTE — Progress Notes (Signed)
Subjective:      Patient ID: Christina [name] is a 26 y.o. female     Chief Complaint   Patient presents with    Anxiety    Depression      Verbal consent has been obtained from the patient to conduct visit and bill insurance for this video visit encounter, performed to minimize exposure to COVID-19 during public emergency. Patient was positively identified visually and was verified to be located in the state of [place] at the time of the visit.    The patient is presenting for a telehealth visit for a follow-up on anxiety and depression.     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 She feels she is managing okay with her current workload but is aware it may change in the fall. She is working from home most times.     Her anxiety has slightly improved. She mentions she has been busier and feels that has provided her with some distraction. She is taking buspirone 30 mg twice daily. She has not taken propranolol 20 mg in a while. She continues to speak to her therapist twice a week.     Her depression has improved. She has not had moment of not wanting to get out of bed. She is taking sertraline 200 mg and Abilify at 5 mg daily. She has a good support system with her friends and parents.     She is sleeping well. She does not get it often, so when she sleeps, she is happy about it. On weekdays, she averages about 7 hours of sleep, and 9 on the weekend.     She does not exercise as much as she should, but she does go for a walk sometimes.     She believes she received her HPV vaccines but is not sure. She will ask her mother about it. 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     Her periods are irregular. She has the Mirena intrauterine device. She had it placed with gynecology about 01/2021. She denies any new partners.     Her constipation has improved because her stress has improved.     She has her medications filled at [facility] local [facility].      The following  sections were reviewed this encounter by the provider:   Tobacco  Allergies  Meds  Problems  Med Hx  Surg Hx  Fam Hx         Review of Systems   Constitutional:  Negative for appetite change.   Psychiatric/Behavioral:  The patient is nervous/anxious.           LMP 11/16/2021 (Approximate)     Objective:     Physical Exam   Physical Exam  Constitutional:       Appearance: Normal appearance.   HENT:      Head: Normocephalic and atraumatic.      Nose: Nose normal.   Pulmonary:      Effort: Pulmonary effort is normal.   Musculoskeletal:      Cervical back: Normal range of motion and neck supple.   Neurological:      General: No focal deficit present.      Mental Status: Alert.   Psychiatric:         Mood and Affect: Mood normal.         Behavior: Behavior normal.         Thought Content: Thought content  normal.  Assessment:     1. GAD (generalized anxiety disorder)  - sertraline (ZOLOFT) 100 MG tablet; Take 2 tablets (200 mg) by mouth daily  Dispense: 180 tablet; Refill: 1  - busPIRone (BUSPAR) 30 MG tablet; Take 1 tablet (30 mg) by mouth daily  Dispense: 180 tablet; Refill: 1    2. Moderate episode of recurrent major depressive disorder  - sertraline (ZOLOFT) 100 MG tablet; Take 2 tablets (200 mg) by mouth daily  Dispense: 180 tablet; Refill: 1  - ARIPiprazole (ABILIFY) 5 MG tablet; Take 1 tablet (5 mg) by mouth daily  Dispense: 90 tablet; Refill: 1    3. IUD (intrauterine device) in place        Plan:   VIDEO VISIT PERFORMED TODAY.  Verbal consent has been obtained from the patient to conduct this video visit encounter.  The patient is present in [place] at the time of this visit.    1.) Generalized anxiety disorder  I have refilled sertraline 100 mg; take 2 tablets daily. I have also refilled buspirone 30 mg to be taken daily.     2.) Moderate episode of recurrent major depressive disorder  I have refilled Abilify 5 mg to be taken daily. Continue to speak with therapist. She has a good support system.      Follow-up in 6 months.     Transcribed for Dr. Ricarda FrameEmily Zamani Crocker, by Armando Reichertanielle Moreno on 12/15/21 at 10:38 AM. Powered by [employer].      Carlena SaxEmily R Saanya Zieske, MD

## 2022-03-24 DIAGNOSIS — R7982 Elevated C-reactive protein (CRP): Secondary | ICD-10-CM | POA: Insufficient documentation

## 2022-03-24 DIAGNOSIS — R0789 Other chest pain: Secondary | ICD-10-CM | POA: Insufficient documentation

## 2022-03-24 DIAGNOSIS — Z8679 Personal history of other diseases of the circulatory system: Secondary | ICD-10-CM | POA: Insufficient documentation

## 2022-03-24 DIAGNOSIS — R700 Elevated erythrocyte sedimentation rate: Secondary | ICD-10-CM | POA: Insufficient documentation

## 2022-03-24 NOTE — Progress Notes (Deleted)
[facility] PROGRESS NOTE    [facility]  [place] [facility]  [address]  [place] [id number]  Dept: [phone]  Dept Fax: [phone]       Patient Name: Christina SkeneASSIDY,Basma M    Date of Visit:  March 24, 2022  Date of Birth: 1996-01-19  AGE: 26 y.o.  Medical Record #: [id number]  Requesting Physician: Rivka SaferMichael A Hartman, MD      CHIEF COMPLAINT: No chief complaint on file.      HISTORY OF PRESENT ILLNESS:    She is a pleasant 26 y.o. female who presents today for follow-up of chest pain.    She has a history of anxiety and depression, syncope, COVID infection in 10/2020, subsequently with pleuritic chest pain worse lying down treated with NSAIDs and colchicine (probably only 3 weeks), complicated by gastritis requiring cessation of those medications, seen by Dr. Marinell Blightamakrishna in consultation on 01/30/2021 with an impression of a smoldering ongoing pericarditis, ordered for ESR CRP and echo and started on colchicine 0.6 mg once a day for 3 months, I do not think lab work was ever done although she did have elevated ESR and CRP in 2007, echocardiogram order expired as well and patient was lost to follow-up, now presents for follow-up of chest discomfort.    03/26/2022: First visit with me          PAST MEDICAL HISTORY: She has a past medical history of Anxiety, Depression, Ruptured ovarian cyst, and Syncope. She has a past surgical history that includes Tonsillectomy; Cholecystectomy (2013); Rhinoplasty (2020); and Septoplasty (2020).    ALLERGIES: No Known Allergies    MEDICATIONS:   Patient's current medications were reviewed. ONLY Cardiac medications were updated unless others were addressed in assessment and plan.    Current Outpatient Medications:     ARIPiprazole (ABILIFY) 5 MG tablet, Take 1 tablet (5 mg) by mouth daily, Disp: 90 tablet, Rfl: 1    busPIRone (BUSPAR) 30 MG tablet, Take 1 tablet (30 mg) by mouth daily, Disp: 180 tablet, Rfl: 1     levonorgestrel (Mirena, 52 MG,) 20 MCG/24HR IUD, Place vaginally once, Disp: , Rfl:     propranolol (INDERAL) 20 MG tablet, TAKE 1 TABLET (20 MG TOTAL) BY MOUTH 3 (THREE) TIMES DAILY AS NEEDED (ANXIETY), Disp: 270 tablet, Rfl: 0    sertraline (ZOLOFT) 100 MG tablet, Take 2 tablets (200 mg) by mouth daily, Disp: 180 tablet, Rfl: 1     FAMILY HISTORY: family history includes Cancer in her paternal grandfather and paternal grandmother; Heart failure in her maternal grandfather; Hypertension in her father and mother.    SOCIAL HISTORY: She reports that she has never smoked. She has never used smokeless tobacco. She reports current alcohol use. She reports that she does not use drugs.    PHYSICAL EXAMINATION    There were no vitals taken for this visit.    Constitutional: Cooperative, alert, no acute distress.  Neck: No carotid bruits, JVP normal.  Cardiac: Regular rate and rhythm, normal S1 and S2; no S3 or S4. No murmurs. No rubs, no gallops.  Pulmonary: Clear to auscultation bilaterally, no wheezing, no rhonchi, no rales.  Extremities: no edema.  Vascular: +2 pulses in radial artery bilaterally, 2+ pedal pulses bilaterally.    ECG: ***      LABS REVIEWED:   Lab Results   Component Value Date    WBC 5.01 03/11/2021    HGB 14.1 03/11/2021  HCT 45.7 (H) 03/11/2021    PLT 279 03/11/2021     Lab Results   Component Value Date    GLU 85 03/11/2021    BUN 6.0 (L) 03/11/2021    CREAT 0.7 03/11/2021    NA 139 03/11/2021    K 4.7 03/11/2021    CL 107 03/11/2021    CO2 25 03/11/2021    AST 15 03/11/2021    ALT 11 03/11/2021     Lab Results   Component Value Date    TSH 1.51 03/11/2021     No results found for: "CHOL", "TRIG", "HDL", "LDL"          IMPRESSION:   [name] is a 26 y.o. female with the following problems:    Clinical history in 2022 was most suggestive for acute pericarditis, at the time in a low level intensity  COVID infection March/April 2022  Diagnosed with pericarditis, was on colchicine and tramadol,  currently on ibuprofen  History of gastritis, unclear if related to the colchicine or the Naprosyn or both  Generalized anxiety disorder  CT chest April 2022: No PE, no aortic aneurysm, normal heart size, no pericardial effusion.      RECOMMENDATIONS:    The patient continues to describe pericarditis symptoms.    We will retrial colchicine a 0.6 mg once a day.  I would like her to complete a 5-month course even if her symptoms resolve  If this is not effective or if she has further recurrences then we should try Arcalyst instead  Obtain 2D echocardiogram to objectively assess her LV systolic function as well as her pericardium  Obtain inflammatory markers including ESR and CRP to guide level of inflammation  Cardiology follow-up in 3 months                                                     No orders of the defined types were placed in this encounter.      No orders of the defined types were placed in this encounter.      SIGNED:    Garth Bigness, MD          This note was generated by the Dragon speech recognition and may contain errors or omissions not intended by the user. Grammatical errors, random word insertions, deletions, pronoun errors, and incomplete sentences are occasional consequences of this technology due to software limitations. Not all errors are caught or corrected. If there are questions or concerns about the content of this note or information contained within the body of this dictation, they should be addressed directly with the author for clarification.

## 2022-03-26 ENCOUNTER — Ambulatory Visit (INDEPENDENT_AMBULATORY_CARE_PROVIDER_SITE_OTHER): Payer: Commercial Managed Care - POS | Admitting: Internal Medicine

## 2022-03-26 DIAGNOSIS — Z8679 Personal history of other diseases of the circulatory system: Secondary | ICD-10-CM

## 2022-03-26 DIAGNOSIS — Z975 Presence of (intrauterine) contraceptive device: Secondary | ICD-10-CM

## 2022-03-26 DIAGNOSIS — R0789 Other chest pain: Secondary | ICD-10-CM

## 2022-03-26 DIAGNOSIS — R7982 Elevated C-reactive protein (CRP): Secondary | ICD-10-CM

## 2022-03-26 DIAGNOSIS — K5901 Slow transit constipation: Secondary | ICD-10-CM

## 2022-03-26 DIAGNOSIS — R700 Elevated erythrocyte sedimentation rate: Secondary | ICD-10-CM

## 2022-03-26 DIAGNOSIS — F331 Major depressive disorder, recurrent, moderate: Secondary | ICD-10-CM

## 2022-03-26 DIAGNOSIS — F411 Generalized anxiety disorder: Secondary | ICD-10-CM

## 2022-05-17 ENCOUNTER — Telehealth (INDEPENDENT_AMBULATORY_CARE_PROVIDER_SITE_OTHER): Payer: Commercial Managed Care - POS | Admitting: Family Medicine

## 2022-05-17 ENCOUNTER — Encounter (INDEPENDENT_AMBULATORY_CARE_PROVIDER_SITE_OTHER): Payer: Self-pay | Admitting: Family Medicine

## 2022-05-17 DIAGNOSIS — F331 Major depressive disorder, recurrent, moderate: Secondary | ICD-10-CM

## 2022-05-17 DIAGNOSIS — F411 Generalized anxiety disorder: Secondary | ICD-10-CM

## 2022-05-17 MED ORDER — BUSPIRONE HCL 30 MG PO TABS
30.0000 mg | ORAL_TABLET | Freq: Every day | ORAL | 1 refills | Status: DC
Start: 2022-05-17 — End: 2022-09-01

## 2022-05-17 MED ORDER — ARIPIPRAZOLE 5 MG PO TABS
5.0000 mg | ORAL_TABLET | Freq: Every day | ORAL | 1 refills | Status: DC
Start: 2022-05-17 — End: 2022-09-01

## 2022-05-17 MED ORDER — PROPRANOLOL HCL ER 60 MG PO CP24
60.0000 mg | ORAL_CAPSULE | Freq: Every day | ORAL | 1 refills | Status: DC
Start: 2022-05-17 — End: 2022-09-01

## 2022-05-17 MED ORDER — SERTRALINE HCL 100 MG PO TABS
200.0000 mg | ORAL_TABLET | Freq: Every day | ORAL | 1 refills | Status: DC
Start: 2022-05-17 — End: 2022-09-01

## 2022-05-17 NOTE — Progress Notes (Signed)
Subjective:      Patient ID: Christina [name] is a 26 y.o. female     Chief Complaint   Patient presents with    Anxiety    Depression      Verbal consent has been obtained from the patient to conduct visit and bill insurance for this video visit encounter, performed to minimize exposure to COVID-19 during public emergency. Patient was positively identified visually and was verified to be located in the state of [place] at the time of the visit.    The patient is presenting for a telehealth visit for a follow-up on anxiety and depression - 6 month follow up.     School is going well, and she is working on her doctorate program - started this fall.  High anxiety with the work load of this program.  Classes 3 days a week.    Her anxiety has slightly improved. She mentions she has been busier and feels that has provided her with some distraction. She is taking buspirone 30 mg daily. She has not taken propranolol 20 mg in a while. She continues to speak to her therapist weekly.    Her depression has improved. She has not had moment of not wanting to get out of bed. She is taking sertraline 200 mg and Abilify at 5 mg daily. She has a good support system with her friends and parents.     Exercise not great with high work load.  Feeling exhausted (more from stress and work load)  Sleep -- plenty  Load of anxiety high in the AM.    She believes she received her HPV vaccines but is not sure. She will ask her mother about it. 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        The following sections were reviewed this encounter by the provider:   Tobacco  Allergies  Meds  Problems  Med Hx  Surg Hx  Fam Hx         Review of Systems   Constitutional:  Negative for appetite change.   Psychiatric/Behavioral:  The patient is nervous/anxious.           There were no vitals taken for this visit.    Objective:     Physical Exam   Physical Exam  Constitutional:       Appearance: Normal appearance.    HENT:      Head: Normocephalic and atraumatic.      Nose: Nose normal.   Pulmonary:      Effort: Pulmonary effort is normal.   Musculoskeletal:      Cervical back: Normal range of motion and neck supple.   Neurological:      General: No focal deficit present.      Mental Status: Alert.   Psychiatric:         Mood and Affect: Mood normal.         Behavior: Behavior normal.         Thought Content: Thought content normal.  Assessment:     1. GAD (generalized anxiety disorder)  - busPIRone (BUSPAR) 30 MG tablet; Take 1 tablet (30 mg) by mouth daily  Dispense: 180 tablet; Refill: 1  - sertraline (ZOLOFT) 100 MG tablet; Take 2 tablets (200 mg) by mouth daily  Dispense: 180 tablet; Refill: 1  - propranolol (INDERAL LA) 60 MG 24 hr capsule; Take 1 capsule (60 mg) by mouth daily  Dispense: 90 capsule; Refill: 1  2. Moderate episode of recurrent major depressive disorder  - sertraline (ZOLOFT) 100 MG tablet; Take 2 tablets (200 mg) by mouth daily  Dispense: 180 tablet; Refill: 1  - ARIPiprazole (ABILIFY) 5 MG tablet; Take 1 tablet (5 mg) by mouth daily  Dispense: 90 tablet; Refill: 1        Plan:       1.) Generalized anxiety disorder  Struggling right now  I have refilled sertraline 100 mg; take 2 tablets daily (200mg )  Refilled buspirone 30 mg - increase from once a day to twice a day  Add in propranolol 60mg  at bedtime  Recommend trying to get outside for a short walk most days    2.) Moderate episode of recurrent major depressive disorder  I have refilled Abilify 5 mg to be taken daily.   Continue to speak with therapist.   She has a good support system.     Follow-up in 2-3 months in the office.      , MD

## 2022-07-19 ENCOUNTER — Ambulatory Visit (INDEPENDENT_AMBULATORY_CARE_PROVIDER_SITE_OTHER): Payer: Commercial Managed Care - POS | Admitting: Family Medicine

## 2022-08-24 ENCOUNTER — Emergency Department: Payer: Commercial Managed Care - POS

## 2022-08-24 ENCOUNTER — Emergency Department
Admission: EM | Admit: 2022-08-24 | Discharge: 2022-08-24 | Disposition: A | Discharge status: Home / Self Care | Payer: Commercial Managed Care - POS | Attending: Student in an Organized Health Care Education/Training Program | Admitting: Student in an Organized Health Care Education/Training Program

## 2022-08-24 DIAGNOSIS — Z1152 Encounter for screening for COVID-19: Secondary | ICD-10-CM | POA: Insufficient documentation

## 2022-08-24 DIAGNOSIS — B349 Viral infection, unspecified: Secondary | ICD-10-CM | POA: Insufficient documentation

## 2022-08-24 DIAGNOSIS — R079 Chest pain, unspecified: Secondary | ICD-10-CM

## 2022-08-24 LAB — ECG 12-LEAD
Atrial Rate: 89 {beats}/min
IHS MUSE NARRATIVE AND IMPRESSION: NORMAL
P Axis: 65 degrees
P-R Interval: 126 ms
Q-T Interval: 346 ms
QRS Duration: 60 ms
QTC Calculation (Bezet): 420 ms
R Axis: 52 degrees
T Axis: 43 degrees
Ventricular Rate: 89 {beats}/min

## 2022-08-24 LAB — COVID-19 (SARS-COV-2) & INFLUENZA  A/B, NAA (ROCHE LIAT)
Influenza A: NOT DETECTED
Influenza B: NOT DETECTED
SARS-CoV-2 Overall Result: NOT DETECTED

## 2022-08-24 MED ORDER — IBUPROFEN 400 MG PO TABS
800.0000 mg | ORAL_TABLET | Freq: Once | ORAL | Status: AC
Start: 2022-08-24 — End: 2022-08-24
  Administered 2022-08-24: 800 mg via ORAL
  Filled 2022-08-24: qty 2

## 2022-08-24 NOTE — Discharge Instructions (Addendum)
Dear Ms. Bailly:    Thank you for choosing the [facility] Emergency Department, the premier emergency department in the [place] area.  I hope your visit today was EXCELLENT. You will receive a survey via text message that will give you the opportunity to provide feedback to your team about your visit. Please do not hesitate to reach out with any questions!    Specific instructions for your visit today:    - Your symptoms are due to a viral illness, which should gradually improve. You can use over the counter medications like Dayquil, Nyquil, and Mucinex for symptoms. Humidity (taking a hot shower, using a humdifier) can help with congestion and cough. Nasal sprays (eg. Saline, Afrin [do not use for more than 3 days], Flonase) and Sudafed (over the counter but behind the pharmacy counter, ingredient pseudoephedrine) can help with nasal congestion. Over the counter medications including Dextromethorphan (DM such as Mucinex DM or Robitussin DM) can help with cough and sleep.   - For pain, you can take [phone] milligrams of Tylenol every 6 hours (not to exceed 4000 milligrams in any 24-hour period) and 400-600 milligrams of ibuprofen or any other NSAID such as Motrin or Advil every 6 hours (you may stop taking these if you develop a burning pain in the upper left part of your belly). You can alternate these medications every 3 hours (eg. Tylenol at noon, ibuprofen at 3pm, Tylenol again at 6pm, etc.).      IF YOU DO NOT CONTINUE TO IMPROVE OR YOUR CONDITION WORSENS, PLEASE CONTACT YOUR DOCTOR OR RETURN IMMEDIATELY TO THE EMERGENCY DEPARTMENT.    Sincerely,  Jaegar Croft, Baltazar Najjar, MD  Attending Emergency Physician  [facility] Emergency Department      OBTAINING A PRIMARY CARE APPOINTMENT    Primary care physicians (PCPs, also known as primary care doctors) are either internists or family medicine doctors. Both types of PCPs focus on health promotion, disease prevention, patient education and counseling, and  treatment of acute and chronic medical conditions.    If you need a primary care doctor, please call the below number and ask who is receiving new patients.     [facility]  Telephone:  [phone]  [email or web address]    DOCTOR REFERRALS  Call [phone] (available 24 hours a day, 7 days a week) if you need any further referrals and we can help you find a primary care doctor or specialist.  Also, available online at:  [email or web address]    YOUR CONTACT INFORMATION  Before leaving please check with registration to make sure we have an up-to-date contact number.  You can call registration at [phone] to update your information.  For questions about your hospital bill, please call [phone].  For questions about your Emergency Dept Physician bill please call [phone].      [place]  If you need help with health or social services, please call 2-1-1 for a free referral to resources in your area.  2-1-1 is a free service connecting people with information on health insurance, free clinics, pregnancy, mental health, dental care, food assistance, housing, and substance abuse counseling.  Also, available online at:  http://www.[place].org    ORTHOPEDIC INJURY   Please know that significant injuries can exist even when an initial x-ray is read as normal or negative.  This can occur because some fractures (broken bones) are not initially visible on x-rays.  For this reason, close outpatient follow-up with your primary care doctor or bone specialist (  orthopedist) is required.    MEDICATIONS AND FOLLOWUP  Please be aware that some prescription medications can cause drowsiness.  Use caution when driving or operating machinery.    The examination and treatment you have received in our Emergency Department is provided on an emergency basis, and is not intended to be a substitute for your primary care physician.  It is important that your doctor checks you again and  that you report any new or remaining problems at that time.      ASSISTANCE WITH INSURANCE    Affordable Care Act  [facility])  Call to start or finish an application, compare plans, enroll or ask a question.  [place]: [phone]  Web:  Healthcare.gov    Help Enrolling in [place]  6107694940 (TOLL-FREE)  [phone] (TTY)  Web:  Http://www.coverva.org    Local Help Enrolling in the [place]  [phone] (MAIN)  Email:  health-help'@nvfs'$ .org  Web:  http://lewis-perez.info/  Address:  [id number] [place], [place] [address]    SEDATING MEDICATIONS  Sedating medications include strong pain medications (e.g. narcotics), muscle relaxers, benzodiazepines (used for anxiety and as muscle relaxers), Benadryl/diphenhydramine and other antihistamines for allergic reactions/itching, and other medications.  If you are unsure if you have received a sedating medication, please ask your physician or nurse.  If you received a sedating medication: DO NOT drive a car. DO NOT operate machinery. DO NOT perform jobs where you need to be alert.  DO NOT drink alcoholic beverages while taking this medicine.     If you get dizzy, sit or lie down at the first signs. Be careful going up and down stairs.  Be extra careful to prevent falls.     Never give this medicine to others.     Keep this medicine out of reach of children.     Do not take or save old medicines. Throw them away when outdated.     Keep all medicines in a cool, dry place. DO NOT keep them in your bathroom medicine cabinet or in a cabinet above the stove.    MEDICATION REFILLS  Please be aware that we cannot refill any prescriptions through the ER. If you need further treatment from what is provided at your ER visit, please follow up with your primary care doctor or your pain management specialist.    [place]  Did you know [facility] has two freestanding ERs  located just a few miles away?  [facility] [place] have short wait times, easy free parking directly in front of the building and top patient satisfaction scores - and the same Board Certified Emergency Medicine doctors as [facility].

## 2022-08-24 NOTE — ED Provider Notes (Signed)
[facility] EMERGENCY DEPARTMENT  ATTENDING PHYSICIAN HISTORY AND PHYSICAL EXAM     Patient Name: Christina [name], Christina [name]  Department:[facility]  Encounter Date:  08/24/2022  Attending Physician: Tiajuana Amass, MD   Age: 27 y.o. female  Patient Room: Z04/Z04  PCP: Rivka Safer, MD           Diagnosis/Disposition:     Final diagnoses:   Viral syndrome       ED Disposition       ED Disposition   Discharge    Condition   --    Date/Time   Tue Aug 24, 2022  4:16 PM    Comment   KANEISHA ELLENBERGER discharge to home/self care.    Condition at disposition: Stable                 Follow-Up Providers (if applicable)    [facility] -Cardiology  [address]  [place] [address]  [phone]        Rivka Safer, MD  [address]  [place] [address]  [phone]             Discharge Medication List as of 08/24/2022  4:16 PM              Medical Decision Making:       ED Course as of 08/24/22 2010   Tue Aug 24, 2022   1534 Ddx includes viral syndrome, pericarditis, myocarditis, MSK strain, PNA, covid, flu. Patient here with cough, nasal congestion, chest tightness esp when coughing. 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 Pending COVID/flu test, CxR, ECG [AZ]   1545 COVID-19 (SARS-CoV-2) and Influenza A/B, NAA ([facility] Rapid)  negative [AZ]   1610 ECG reviewed and interpreted by me - NSR rate 89. N ST depressions or elevations. No STEMI.  [AZ]   1615 Patient's workup is reassuring.  I discussed with her likely viral syndrome, currently no evidence of COVID, flu, pneumonia.  Lower suspicion for pericarditis given symptoms appear more consistent with MSK etiology from her coughing.  We discussed follow-up with cardiology if symptoms not improving, return to ED for worsening symptoms.  Discussed using NSAIDs as needed, supportive care.  Stable for discharge. [AZ]      ED Course User Index  [AZ] Bosie Clos Loleta Rose, MD       Medical Decision Making  Amount and/or Complexity of Data  Reviewed  Labs: ordered. Decision-making details documented in ED Course.  Radiology: ordered.  ECG/medicine tests: ordered.    Risk  Prescription drug management.                            History of Presenting Illness:     Nursing Triage note: Ambulated to ED c/o coughing constant, congestion since Friday. feeling feverish. ches wall hurting due to coughing.  Chief complaint: Cough    Christina [name] is a 27 y.o. female with a history of COVID complicated by pericarditis who presents emergency department with coughing and congestion for the last 4 days.  She says she has chest pain especially after coughing, although does have some discomfort at rest.  No shortness of breath.  Denies chance of pregnancy.  No fevers.          Review of Systems:  Physical Exam:     Review of Systems    Positive and negative ROS per above and in HPI. All  other systems reviewed and negative.     Pulse 67  BP 103/69  Resp 16  SpO2 99 %  Temp 98 F (36.7 C)     Physical Exam    Nursing notes and vital signs reviewed    General: Patient in no acute distress, overall appears well, nontoxic  Head: Normocephalic, atraumatic  Neck: supple  Eyes: No scleral icterus  Ears/Nose/Throat: Moist mucous membranes, airway is patent, no facial swelling  Cardiovascular: Regular rate and rhythm with no murmurs/rubs appreciated, no LE swelling  Pulmonary: Normal respiratory effort, CTAB w/o wheezes/crackles  GI: Abdomen is soft, non-distended, and non-tender.  Peritoneal signs are absent.  Back: No gross deformities.  Musculoskeletal: No gross injuries or deformities.  Neurological: Alert and oriented x3, face symmetric, hearing intact to voice, speech normal, no gross deficits  Psych: Behaving appropriately  Skin: Warm, dry, no rashes            Interpretations, Clinical Decision Tools and Critical Care:     O2 Sat:  The patient's oxygen saturation was 99 % on room air. This was independently interpreted by me as Normal.      ECG reviewed and  interpreted by me: see ED course          Procedures:   Procedures      Attestations:     Scribe Attestation: There was no scribe involved in the care of this patient.     Documentation Notes:  Parts of this note were generated by the Epic EMR system/ Dragon speech recognition and may contain inherent errors or omissions not intended by the user. Grammatical errors, random word insertions, deletions, pronoun errors and incomplete sentences are occasional consequences of this technology due to software limitations. Not all errors are caught or corrected.  My documentation is often completed after the patient is no longer under my clinical care. In some cases, the Epic EMR may pull updated results into the above documentation which may not reflect all results or information that were available to me at the time of my medical decision making.   If there are questions or concerns about the content of this note or information contained within the body of this dictation they should be addressed directly with the author for clarification.                  Sebastian Ache, MD  08/24/22 2010

## 2022-09-01 ENCOUNTER — Ambulatory Visit (INDEPENDENT_AMBULATORY_CARE_PROVIDER_SITE_OTHER): Payer: Commercial Managed Care - POS | Admitting: Family Medicine

## 2022-09-01 ENCOUNTER — Encounter (INDEPENDENT_AMBULATORY_CARE_PROVIDER_SITE_OTHER): Payer: Self-pay | Admitting: Family Medicine

## 2022-09-01 DIAGNOSIS — F411 Generalized anxiety disorder: Secondary | ICD-10-CM

## 2022-09-01 DIAGNOSIS — F331 Major depressive disorder, recurrent, moderate: Secondary | ICD-10-CM

## 2022-09-01 MED ORDER — ARIPIPRAZOLE 10 MG PO TABS
10.0000 mg | ORAL_TABLET | Freq: Every day | ORAL | 1 refills | Status: DC
Start: 2022-09-01 — End: 2023-05-23

## 2022-09-01 MED ORDER — BUSPIRONE HCL 10 MG PO TABS
20.0000 mg | ORAL_TABLET | Freq: Two times a day (BID) | ORAL | 0 refills | Status: DC
Start: 2022-09-01 — End: 2022-09-23

## 2022-09-01 MED ORDER — PROPRANOLOL HCL ER 60 MG PO CP24
60.0000 mg | ORAL_CAPSULE | Freq: Every evening | ORAL | 1 refills | Status: DC
Start: 2022-09-01 — End: 2023-05-23

## 2022-09-01 MED ORDER — SERTRALINE HCL 100 MG PO TABS
200.0000 mg | ORAL_TABLET | Freq: Every day | ORAL | 1 refills | Status: DC
Start: 2022-09-01 — End: 2023-03-30

## 2022-09-01 NOTE — Progress Notes (Signed)
Subjective:      Patient ID: Christina [name] is a 27 y.o. female     Chief Complaint   Patient presents with    Anxiety     Refill requested on Abilify         HPI  Christina [name] is a 27 year old female who presents today for a follow-up on depression. She was recently seen in the emergency room.    Patient was sick for approximately 1-1/2 weeks. She believed she had COVID-19 or the flu, but it was not.    Patient is feeling better.    School has been difficult for her due to her anxiety and depression. Her anxiety was so high that it affected her concentration and motivation. She has always been an A student, but she is not an A student anymore. She is still working with her therapist weekly. Her next semester will be better, but she is not sure what her next semester will be. Her anxiety was mostly in-person school, but it was difficult for her to go in for routine things. Her anxiety was difficult for her sitting in a classroom and not understanding the material. She panics when she is in the middle of a classroom. She does well with calendar management, but it is overwhelming when she looks at it. She stays at home over the holidays. She has a couple more weeks left of school, so she is going to [place] with her best friend in college.    Patient is taking propranolol at bedtime. She feels at ease once she takes it, and she falls asleep right after she takes it. She sleeps through the night. She goes to the gym in her apartment and walks on the treadmill 2 to 3 times a week. She is trying to push herself from an anxiety standpoint.    Patient takes BuSpar in the morning, but sometimes she takes a second dose. It makes her tired. It does not necessarily pick her up, but she gets a 3:00 PM crash every day, and it makes it worse. She notices it in the morning, but she has her coffee in the morning, and she does okay.    Patient's blood pressure is good today.    Patient received her influenza vaccine. She received  her HPV vaccine.       The following sections were reviewed this encounter by the provider:   Tobacco  Allergies  Meds  Problems  Med Hx  Surg Hx  Fam Hx             BP 102/74 (BP Site: Left arm, Patient Position: Sitting, Cuff Size: Medium)   Pulse 86   Temp 97.7 F (36.5 C) (Temporal)   Resp 18   Ht 1.626 m (5\' 4" )   Wt 66.5 kg (146 lb 8 oz)   BMI 25.15 kg/m     Objective:     Physical Exam    Constitutional:  Appearance: Normal appearance. Normal weight.  HENT:  Head: Normocephalic and atraumatic.  Right Ear: Tympanic membrane, ear canal and external ear normal.  Left Ear: Tympanic membrane, ear canal and external ear normal.  Nose: Nose normal.  Mouth/Throat:  Mouth: Mucous membranes are moist.  Eyes:  General: No scleral icterus.  Extraocular Movements: Extraocular movements intact.  Pupils: Pupils are equal, round, and reactive to light.  Cardiovascular:  Rate and Rhythm: Normal rate and regular rhythm.  Pulses: Normal pulses.  Heart sounds: Normal heart sounds.  Pulmonary:  Effort: Pulmonary effort is normal.  Breath sounds: Normal breath sounds.  Abdominal:  General: Abdomen is flat. Bowel sounds are normal.  Palpations: Abdomen is soft.  Musculoskeletal:  General: Normal range of motion.  Cervical back: Normal range of motion and neck supple.  Skin:  General: Skin is warm.  Neurological:  General: No focal deficit present.  Mental Status: Alert and oriented to person, place, and time.  Psychiatric:  Mood and Affect: Mood normal.     Assessment:     1. Moderate episode of recurrent major depressive disorder  - ARIPiprazole (ABILIFY) 10 MG tablet; Take 1 tablet (10 mg) by mouth daily  Dispense: 90 tablet; Refill: 1  - sertraline (ZOLOFT) 100 MG tablet; Take 2 tablets (200 mg) by mouth daily  Dispense: 180 tablet; Refill: 1    2. GAD (generalized anxiety disorder)  - busPIRone (BUSPAR) 10 MG tablet; Take 2 tablets (20 mg) by mouth 2 (two) times daily  Dispense: 120 tablet; Refill: 0  -  propranolol (INDERAL LA) 60 MG 24 hr capsule; Take 1 capsule (60 mg) by mouth nightly  Dispense: 90 capsule; Refill: 1  - sertraline (ZOLOFT) 100 MG tablet; Take 2 tablets (200 mg) by mouth daily  Dispense: 180 tablet; Refill: 1        Plan:     Depression and anxiety.  Continue propranolol as prescribed.   Continue sertraline 200 mg as prescribed.   Decrease buspirone from 30 mg twice daily to 20 mg twice daily.  INCREASE abilify from 5mg  to 10mg  daily    Continues weekly therapy    Follow up in 10/2022.    Donnita Falls, MD    Transcribed for Dr. Rowe Clack, by Gaynell Face on 09/01/2022 at 4:13 p.m. Powered by [employer].

## 2022-09-02 NOTE — Patient Instructions (Signed)
fr

## 2022-09-23 ENCOUNTER — Other Ambulatory Visit (INDEPENDENT_AMBULATORY_CARE_PROVIDER_SITE_OTHER): Payer: Self-pay | Admitting: Family Medicine

## 2022-09-23 DIAGNOSIS — F411 Generalized anxiety disorder: Secondary | ICD-10-CM

## 2022-09-23 NOTE — Telephone Encounter (Signed)
Medication(s) requested: busPIRone (BUSPAR) 10 MG tablet   Medication(s) last refilled: 09/01/22 #120 x0  Last visit for this medication: 09/01/22  Due for follow-up:  March 2024  Upcoming appt and reason:  none    Ok for 90 day supply?

## 2023-01-07 ENCOUNTER — Encounter (INDEPENDENT_AMBULATORY_CARE_PROVIDER_SITE_OTHER): Payer: Self-pay

## 2023-03-30 ENCOUNTER — Encounter (INDEPENDENT_AMBULATORY_CARE_PROVIDER_SITE_OTHER): Payer: Self-pay | Admitting: Family Medicine

## 2023-03-30 ENCOUNTER — Ambulatory Visit (FREE_STANDING_LABORATORY_FACILITY): Payer: Commercial Managed Care - POS | Admitting: Family Medicine

## 2023-03-30 VITALS — BP 106/68 | HR 70 | Temp 98.2°F | Resp 18 | Ht 64.0 in | Wt 144.0 lb

## 2023-03-30 DIAGNOSIS — Z23 Encounter for immunization: Secondary | ICD-10-CM

## 2023-03-30 DIAGNOSIS — F411 Generalized anxiety disorder: Secondary | ICD-10-CM

## 2023-03-30 DIAGNOSIS — F331 Major depressive disorder, recurrent, moderate: Secondary | ICD-10-CM

## 2023-03-30 DIAGNOSIS — K219 Gastro-esophageal reflux disease without esophagitis: Secondary | ICD-10-CM

## 2023-03-30 LAB — LAB USE ONLY - CBC WITH DIFFERENTIAL
Absolute Basophils: 0.03 10*3/uL (ref 0.00–0.08)
Absolute Eosinophils: 0.06 10*3/uL (ref 0.00–0.44)
Absolute Immature Granulocytes: 0.02 10*3/uL (ref 0.00–0.07)
Absolute Lymphocytes: 1.8 10*3/uL (ref 0.42–3.22)
Absolute Monocytes: 0.35 10*3/uL (ref 0.21–0.85)
Absolute Neutrophils: 2.75 10*3/uL (ref 1.10–6.33)
Absolute nRBC: 0 10*3/uL (ref <=0.00)
Basophils %: 0.6 %
Eosinophils %: 1.2 %
Hematocrit: 43.4 % (ref 34.7–43.7)
Hemoglobin: 14.1 g/dL (ref 11.4–14.8)
Immature Granulocytes %: 0.4 %
Lymphocytes %: 35.9 %
MCH: 29.9 pg (ref 25.1–33.5)
MCHC: 32.5 g/dL (ref 31.5–35.8)
MCV: 92.1 fL (ref 78.0–96.0)
MPV: 11.3 fL (ref 8.9–12.5)
Monocytes %: 7 %
Neutrophils %: 54.9 %
Platelet Count: 305 10*3/uL (ref 142–346)
Preliminary Absolute Neutrophil Count: 2.75 10*3/uL (ref 1.10–6.33)
RBC: 4.71 10*6/uL (ref 3.90–5.10)
RDW: 13 % (ref 11–15)
WBC: 5.01 10*3/uL (ref 3.10–9.50)
nRBC %: 0 /100 WBC (ref <=0.0)

## 2023-03-30 LAB — COMPREHENSIVE METABOLIC PANEL
ALT: 13 U/L (ref 0–55)
AST (SGOT): 15 U/L (ref 5–41)
Albumin/Globulin Ratio: 1.6 (ref 0.9–2.2)
Albumin: 4.2 g/dL (ref 3.5–5.0)
Alkaline Phosphatase: 58 U/L (ref 37–117)
Anion Gap: 10 (ref 5.0–15.0)
BUN: 7 mg/dL (ref 7–21)
Bilirubin, Total: 0.8 mg/dL (ref 0.2–1.2)
CO2: 22 mEq/L (ref 17–29)
Calcium: 9.8 mg/dL (ref 8.5–10.5)
Chloride: 108 mEq/L (ref 99–111)
Creatinine: 0.7 mg/dL (ref 0.4–1.0)
GFR: >60 mL/min/{1.73_m2} (ref >=60.0)
Globulin: 2.6 g/dL (ref 2.0–3.6)
Glucose: 87 mg/dL (ref 70–100)
Hemolysis Index: 6 Index
Potassium: 4.6 mEq/L (ref 3.5–5.3)
Protein, Total: 6.8 g/dL (ref 6.0–8.3)
Sodium: 140 mEq/L (ref 135–145)

## 2023-03-30 LAB — TSH: TSH: 2.07 u[IU]/mL (ref 0.35–4.94)

## 2023-03-30 MED ORDER — LORAZEPAM 0.5 MG PO TABS
0.5000 mg | ORAL_TABLET | Freq: Every day | ORAL | 0 refills | Status: DC | PRN
Start: 2023-03-30 — End: 2023-05-23

## 2023-03-30 MED ORDER — ESCITALOPRAM OXALATE 10 MG PO TABS
10.0000 mg | ORAL_TABLET | Freq: Every day | ORAL | 11 refills | Status: DC
Start: 2023-03-30 — End: 2023-04-22

## 2023-03-30 MED ORDER — FAMOTIDINE 40 MG PO TABS
40.0000 mg | ORAL_TABLET | Freq: Every day | ORAL | 5 refills | Status: DC
Start: 2023-03-30 — End: 2023-10-17

## 2023-03-30 NOTE — Progress Notes (Signed)
Subjective:      Patient ID: Christina [name] is a 27 y.o. female     Chief Complaint   Patient presents with    Anxiety        HPI  History of Present Illness  The patient presents for evaluation of multiple medical concerns.    She is currently engaged in full-time work and school, which includes a [employer]. Despite maintaining a good sleep schedule, she often feels fatigued and struggles to distinguish between physical and mental exhaustion.    She discontinued her Zoloft medication after her last visit due to side effects such as intense vertigo, nausea, and fainting. She has been off Zoloft for 3 to 4 months and has noticed an increase in her anxiety levels. She underwent genetic testing with a psychiatrist in eighth grade and found that Prozac was ineffective for her. She is considering Lexapro as a potential treatment option. Abilify has been beneficial for her depression and mood, but not for her anxiety. She takes BuSpar twice daily, once in the morning and once at night, and Abilify at night. She also mentions that her panic attacks have worsened.    She has an intrauterine device (IUD) and recently experienced her first menstrual period in a long time. She contracted COVID-19 for the third time in June 2024, which resulted in a severe illness lasting approximately 10 days. She has not received the HPV vaccine and her last Pap smear was conducted in 2023, with normal results. She is under the care of a nurse practitioner at an OB-GYN office.  Prozac - did help       The following sections were reviewed this encounter by the provider:   Tobacco  Allergies  Meds  Problems  Med Hx  Surg Hx  Fam Hx             BP 106/68 (BP Site: Right arm, Patient Position: Sitting, Cuff Size: Medium)   Pulse 70   Temp 98.2 F (36.8 C) (Temporal)   Resp 18   Ht 1.626 m (5\' 4" )   Wt 65.3 kg (144 lb)   SpO2 99%   BMI 24.72 kg/m     Objective:     Physical Exam  Constitutional:       Appearance: Normal  appearance. She is normal weight.   HENT:      Head: Normocephalic and atraumatic.      Right Ear: Tympanic membrane, ear canal and external ear normal.      Left Ear: Tympanic membrane, ear canal and external ear normal.      Nose: Nose normal.      Mouth/Throat:      Mouth: Mucous membranes are moist.   Eyes:      General: No scleral icterus.     Extraocular Movements: Extraocular movements intact.      Pupils: Pupils are equal, round, and reactive to light.   Cardiovascular:      Rate and Rhythm: Normal rate and regular rhythm.      Pulses: Normal pulses.      Heart sounds: Normal heart sounds.   Pulmonary:      Effort: Pulmonary effort is normal.      Breath sounds: Normal breath sounds.   Abdominal:      General: Abdomen is flat. Bowel sounds are normal.      Palpations: Abdomen is soft.   Musculoskeletal:         General: Normal range of motion.  Cervical back: Normal range of motion and neck supple.   Skin:     General: Skin is warm.   Neurological:      General: No focal deficit present.      Mental Status: She is alert and oriented to person, place, and time.   Psychiatric:         Mood and Affect: Mood normal.       Physical Exam        Results          Assessment:     1. GAD (generalized anxiety disorder)  - escitalopram (Lexapro) 10 MG tablet; Take 1 tablet (10 mg) by mouth daily  Dispense: 30 tablet; Refill: 11  - Follow Up In Primary Care; Future  - LORazepam (ATIVAN) 0.5 MG tablet; Take 1 tablet (0.5 mg) by mouth daily as needed for Anxiety  Dispense: 20 tablet; Refill: 0  - CBC with Differential (Order); Future  - Comprehensive Metabolic Panel; Future  - TSH; Future    2. Moderate episode of recurrent major depressive disorder  - escitalopram (Lexapro) 10 MG tablet; Take 1 tablet (10 mg) by mouth daily  Dispense: 30 tablet; Refill: 11  - Follow Up In Primary Care; Future  - Comprehensive Metabolic Panel; Future    3. Immunization due  - HPV 9 valent recombinant IM    4. Gastroesophageal reflux  disease without esophagitis  - famotidine (PEPCID) 40 MG tablet; Take 1 tablet (40 mg) by mouth daily  Dispense: 30 tablet; Refill: 5        Plan:     Assessment & Plan  1. Anxiety.  Symptoms are indicative of anxiety, exacerbated by discontinuation of sertraline. Lexapro 10 mg was prescribed, to be taken as half a tablet (5 mg) at night for the first two weeks. If well-tolerated, the dose will be increased to 10 mg. BuSpar should be taken twice daily, once in the morning and once at night. Continue the nightly Abilify regimen. A prescription for lorazepam was provided for short-term management of panic attacks. Potential side effects of Lexapro, including gastrointestinal upset and insomnia, were discussed. If symptoms persist or worsen, adjustments to the medication regimen will be considered.    2. Health Maintenance.  The first dose of the HPV vaccine will be administered today, with the second dose scheduled for two months later. She was advised to schedule a Pap smear with her OB-GYN, as her last Pap smear was over a year ago.    Follow-up  A follow-up visit is scheduled for 6 weeks from now.      Carlena Sax, MD

## 2023-04-01 NOTE — Progress Notes (Signed)
Labs really look great!

## 2023-04-21 ENCOUNTER — Other Ambulatory Visit (INDEPENDENT_AMBULATORY_CARE_PROVIDER_SITE_OTHER): Payer: Self-pay | Admitting: Family Medicine

## 2023-04-21 DIAGNOSIS — F411 Generalized anxiety disorder: Secondary | ICD-10-CM

## 2023-04-21 DIAGNOSIS — F331 Major depressive disorder, recurrent, moderate: Secondary | ICD-10-CM

## 2023-04-21 NOTE — Telephone Encounter (Signed)
Last OV 03/30/23 RTO FU 6 wks  FU VV scheduled 05/23/23  Last Rx 03/30/23 # 30 with 11 refills  Requesting a 90 day Rx

## 2023-05-23 ENCOUNTER — Encounter (INDEPENDENT_AMBULATORY_CARE_PROVIDER_SITE_OTHER): Payer: Self-pay | Admitting: Family Medicine

## 2023-05-23 ENCOUNTER — Telehealth (INDEPENDENT_AMBULATORY_CARE_PROVIDER_SITE_OTHER): Payer: Commercial Managed Care - POS | Admitting: Family Medicine

## 2023-05-23 VITALS — Wt 144.0 lb

## 2023-05-23 DIAGNOSIS — F411 Generalized anxiety disorder: Secondary | ICD-10-CM

## 2023-05-23 DIAGNOSIS — F331 Major depressive disorder, recurrent, moderate: Secondary | ICD-10-CM

## 2023-05-23 MED ORDER — ESCITALOPRAM OXALATE 10 MG PO TABS
10.0000 mg | ORAL_TABLET | Freq: Every day | ORAL | 0 refills | Status: DC
Start: 2023-05-23 — End: 2023-07-18

## 2023-05-23 MED ORDER — CLONAZEPAM 1 MG PO TABS
1.0000 mg | ORAL_TABLET | Freq: Two times a day (BID) | ORAL | 1 refills | Status: DC | PRN
Start: 2023-05-23 — End: 2023-07-18

## 2023-05-23 MED ORDER — ARIPIPRAZOLE 10 MG PO TABS
10.0000 mg | ORAL_TABLET | Freq: Every day | ORAL | 1 refills | Status: DC
Start: 2023-05-23 — End: 2024-07-20

## 2023-05-23 MED ORDER — BUSPIRONE HCL 10 MG PO TABS
20.0000 mg | ORAL_TABLET | Freq: Two times a day (BID) | ORAL | 1 refills | Status: AC
Start: 2023-05-23 — End: ?

## 2023-05-23 MED ORDER — PROPRANOLOL HCL ER 60 MG PO CP24
60.0000 mg | ORAL_CAPSULE | Freq: Every evening | ORAL | 1 refills | Status: DC
Start: 2023-05-23 — End: 2023-10-17

## 2023-05-23 NOTE — Progress Notes (Signed)
Patient is in the state of [place] during appointment.   Vital signs NOT checked by patient.   Wants refill.

## 2023-05-23 NOTE — Progress Notes (Signed)
Subjective:      Patient ID: RAYN SHORB is a 27 y.o. female     Chief Complaint   Patient presents with    Anxiety    Immunizations     Scheduled nurse visit for HPV #2 and flu shot on 11/15.    Scheduled pap with gyn in a couple of months.      Verbal consent has been obtained from the patient to conduct visit and bill insurance for this video visit encounter, performed to minimize exposure to COVID-19 during public emergency. Patient was positively identified visually and was verified to be located in the state of [place] at the time of the visit.    HPI  History of Present Illness  The patient, a student, presents with a chief complaint of high anxiety levels, exacerbated by a heavy course load and interpersonal dynamics at school and work. She reports feeling "on edge" and "going crazy at times," with anxiety levels so high that even two doses of lorazepam barely touch the symptoms. The patient does not experience fatigue or sleepiness from the lorazepam. She is currently seeing a therapist one to two times a week, which provides some support.    In addition to anxiety, the patient has been feeling unwell for the past four days due to a common illness, experiencing symptoms such as congestion and pressure. Despite trying Mucinex and Sudafed, the symptoms have progressively worsened.    The patient's current medication regimen includes Buspar, Lexapro, Abilify, and propranolol. She has also been taking lorazepam once or twice a week to manage her anxiety. However, she reports that even two doses of lorazepam barely touch her symptoms.    The patient is also scheduled to receive her second HPV shot in the near future. She has not reported any adverse effects from the first shot.    Current Regiment: Abilify 10mg   Lexapro 10mg   Buspar 20mg  BID  Propranolol at bedtime    Prn Lorazepam     The following sections were reviewed this encounter by the provider:   Tobacco  Allergies  Meds  Problems  Med Hx  Surg  Hx  Fam Hx             Wt 65.3 kg (144 lb)   LMP  (LMP Unknown)   BMI 24.72 kg/m     Objective:     Physical Exam  Constitutional:       Appearance: Normal appearance.   HENT:      Head: Normocephalic and atraumatic.      Nose: Nose normal.   Pulmonary:      Effort: Pulmonary effort is normal.   Musculoskeletal:      Cervical back: Normal range of motion and neck supple.   Neurological:      General: No focal deficit present.      Mental Status: She is alert.   Psychiatric:         Mood and Affect: Mood normal.         Behavior: Behavior normal.         Thought Content: Thought content normal.       Physical Exam        Results          Assessment:     1. GAD (generalized anxiety disorder)  - escitalopram (LEXAPRO) 10 MG tablet; Take 1 tablet (10 mg) by mouth daily  Dispense: 90 tablet; Refill: 0  - Follow Up In Primary Care  -  clonazePAM (KlonoPIN) 1 MG tablet; Take 1 tablet (1 mg) by mouth 2 (two) times daily as needed for Anxiety  Dispense: 60 tablet; Refill: 1  - busPIRone (BUSPAR) 10 MG tablet; Take 2 tablets (20 mg) by mouth 2 (two) times daily  Dispense: 360 tablet; Refill: 1  - propranolol (INDERAL LA) 60 MG 24 hr capsule; Take 1 capsule (60 mg) by mouth nightly  Dispense: 90 capsule; Refill: 1    2. Moderate episode of recurrent major depressive disorder  - escitalopram (LEXAPRO) 10 MG tablet; Take 1 tablet (10 mg) by mouth daily  Dispense: 90 tablet; Refill: 0  - Follow Up In Primary Care  - ARIPiprazole (ABILIFY) 10 MG tablet; Take 1 tablet (10 mg) by mouth daily  Dispense: 90 tablet; Refill: 1        Plan:     Assessment & Plan  Upper Respiratory Infection  Symptoms for 4 days with worsening congestion and pressure. No improvement with Mucinex and Sudafed.  -Start Afrin nasal spray twice a day for three days.  -Continue Mucinex and Nyquil as needed.  -Perform sinus rinse twice a day for three days.    Anxiety  High levels of anxiety reported, with lorazepam providing minimal relief even at increased  dose.  -Discontinue lorazepam.  -START clonazepam 1mg  twice a day for two weeks, then reassess for possible reduction to once daily.  -Continue current regimen of Abilify 10mg , Buspar 20mg  twice a day, Lexapro 10mg , and propranolol at bedtime.  -Continue therapy sessions 1-2 times per week.    General Health Maintenance  -Nurse visit scheduled - for HPV #2 and Flu shot  -Scheduled follow-up appointment for 07/18/2023 at 8:30am.      Carlena Sax, MD

## 2023-07-08 ENCOUNTER — Ambulatory Visit (INDEPENDENT_AMBULATORY_CARE_PROVIDER_SITE_OTHER): Payer: Commercial Managed Care - POS

## 2023-07-18 ENCOUNTER — Telehealth (INDEPENDENT_AMBULATORY_CARE_PROVIDER_SITE_OTHER): Payer: Commercial Managed Care - POS | Admitting: Family Medicine

## 2023-07-18 ENCOUNTER — Encounter (INDEPENDENT_AMBULATORY_CARE_PROVIDER_SITE_OTHER): Payer: Self-pay | Admitting: Family Medicine

## 2023-07-18 VITALS — Wt 135.0 lb

## 2023-07-18 DIAGNOSIS — F411 Generalized anxiety disorder: Secondary | ICD-10-CM

## 2023-07-18 DIAGNOSIS — F331 Major depressive disorder, recurrent, moderate: Secondary | ICD-10-CM

## 2023-07-18 MED ORDER — CLONAZEPAM 1 MG PO TABS
1.0000 mg | ORAL_TABLET | Freq: Two times a day (BID) | ORAL | 1 refills | Status: DC | PRN
Start: 2023-07-18 — End: 2023-10-17

## 2023-07-18 MED ORDER — ESCITALOPRAM OXALATE 10 MG PO TABS
10.0000 mg | ORAL_TABLET | Freq: Every day | ORAL | 0 refills | Status: DC
Start: 2023-07-18 — End: 2023-10-17

## 2023-07-18 NOTE — Progress Notes (Signed)
Patient is in the state of [place] during appointment.   Vital signs NOT checked by patient.   Wants refill.

## 2023-07-18 NOTE — Progress Notes (Signed)
Subjective:      Patient ID: Christina [name] is a 27 y.o. female     Chief Complaint   Patient presents with    Anxiety    Verbal consent has been obtained from the patient to conduct visit and bill insurance for this video visit encounter, performed to minimize exposure to COVID-19 during public emergency. Patient was positively identified visually and was verified to be located in the state of [place] at the time of the visit.      HPI  History of Present Illness  The patient presents for evaluation of anxiety.    She reports that her condition has improved since starting Lexapro, although she feels her body is still adjusting to the medication. She takes clonazepam in the evening and occasionally around noon or 1:00 PM when her anxiety levels rise, but this is not a daily requirement. Her sleep pattern is generally good, averaging between 6 to 8 hours per night. She takes buspirone once daily, typically in the morning, and occasionally engages in exercise.      SE with sertraline  Introduction of lexapro in August  Clonazepam - always taking in the evening.    Continued the abilify 10mg  at bedtime       Year 2 of 5. Starting clinical with class.       The following sections were reviewed this encounter by the provider:   Tobacco  Allergies  Meds  Problems  Med Hx  Surg Hx  Fam Hx             Wt 61.2 kg (135 lb)   LMP  (LMP Unknown)   BMI 23.17 kg/m     Objective:     Physical Exam  Constitutional:       Appearance: Normal appearance.   HENT:      Head: Normocephalic and atraumatic.      Nose: Nose normal.   Pulmonary:      Effort: Pulmonary effort is normal.   Musculoskeletal:      Cervical back: Normal range of motion and neck supple.   Neurological:      General: No focal deficit present.      Mental Status: She is alert.   Psychiatric:         Mood and Affect: Mood normal.         Behavior: Behavior normal.         Thought Content: Thought content normal.       Physical Exam        Results           Assessment:     1. GAD (generalized anxiety disorder)  - clonazePAM (KlonoPIN) 1 MG tablet; Take 1 tablet (1 mg) by mouth 2 (two) times daily as needed for Anxiety  Dispense: 60 tablet; Refill: 1  - escitalopram (LEXAPRO) 10 MG tablet; Take 1 tablet (10 mg) by mouth daily  Dispense: 90 tablet; Refill: 0  - Follow Up In Primary Care; Future    2. Moderate episode of recurrent major depressive disorder  - escitalopram (LEXAPRO) 10 MG tablet; Take 1 tablet (10 mg) by mouth daily  Dispense: 90 tablet; Refill: 0        Plan:     Assessment & Plan  1. Anxiety.  She reports that Lexapro is better tolerated than sertraline, but she is still adjusting to it. She takes clonazepam in the evening and sometimes around noon if needed. She is advised to maintain a  consistent regimen of buspirone, taking it in the morning and early afternoon. If this proves insufficient, she may supplement with clonazepam. Encouragement was given to engage in regular physical activity, such as walking. Prescriptions for Lexapro 10 mg and clonazepam were refilled.    2. Health Maintenance.  She is scheduled for her next HPV vaccine on Friday. She was reminded to schedule a follow-up Pap smear with GYN during her next break.    Follow-up  Return in 3 months for follow up.      Carlena Sax, MD

## 2023-07-22 ENCOUNTER — Ambulatory Visit (INDEPENDENT_AMBULATORY_CARE_PROVIDER_SITE_OTHER): Payer: Commercial Managed Care - POS

## 2023-09-26 ENCOUNTER — Telehealth (INDEPENDENT_AMBULATORY_CARE_PROVIDER_SITE_OTHER): Payer: Commercial Managed Care - POS | Admitting: Family Medicine

## 2023-10-17 ENCOUNTER — Encounter (INDEPENDENT_AMBULATORY_CARE_PROVIDER_SITE_OTHER): Payer: Self-pay | Admitting: Family Medicine

## 2023-10-17 ENCOUNTER — Telehealth (INDEPENDENT_AMBULATORY_CARE_PROVIDER_SITE_OTHER): Payer: Commercial Managed Care - POS | Admitting: Family Medicine

## 2023-10-17 VITALS — Ht 64.0 in | Wt 135.0 lb

## 2023-10-17 DIAGNOSIS — F331 Major depressive disorder, recurrent, moderate: Secondary | ICD-10-CM

## 2023-10-17 DIAGNOSIS — F411 Generalized anxiety disorder: Secondary | ICD-10-CM

## 2023-10-17 DIAGNOSIS — R110 Nausea: Secondary | ICD-10-CM

## 2023-10-17 DIAGNOSIS — R000 Tachycardia, unspecified: Secondary | ICD-10-CM | POA: Insufficient documentation

## 2023-10-17 MED ORDER — ONDANSETRON 4 MG PO TBDP
4.0000 mg | ORAL_TABLET | Freq: Three times a day (TID) | ORAL | 1 refills | Status: DC | PRN
Start: 2023-10-17 — End: 2024-07-20

## 2023-10-17 MED ORDER — ESCITALOPRAM OXALATE 10 MG PO TABS
10.0000 mg | ORAL_TABLET | Freq: Every day | ORAL | 1 refills | Status: DC
Start: 2023-10-17 — End: 2024-07-20

## 2023-10-17 MED ORDER — PROPRANOLOL HCL ER 60 MG PO CP24
60.0000 mg | ORAL_CAPSULE | Freq: Every evening | ORAL | 1 refills | Status: AC
Start: 2023-10-17 — End: ?

## 2023-10-17 MED ORDER — CLONAZEPAM 1 MG PO TABS
1.0000 mg | ORAL_TABLET | Freq: Every evening | ORAL | 2 refills | Status: DC | PRN
Start: 2023-10-17 — End: 2024-07-20

## 2023-10-17 NOTE — Progress Notes (Signed)
 Subjective:      Patient ID: Christina [name] is a 28 y.o. female     Chief Complaint   Patient presents with    Anxiety     Follow up.     Verbal consent has been obtained from the patient to conduct visit and bill insurance for this video visit encounter. Patient was positively identified visually and was verified to be located in the state of [place]  at the time of the visit.  I was also in the state of [place]  at the time of the visit, working form the office.      HPI  History of Present Illness  Christina [name] is a 28 year old female with anxiety and depression who presents with difficulty managing medication due to nausea.    She experiences significant nausea that has prevented her from maintaining her medication regimen, specifically Lexapro . The nausea worsens when she attempts to restart the medication, impacting her ability to function. She is seeking a solution to manage the nausea to help her resume her medication.    Her anxiety and depression have been incredibly high since the new year. She uses clonazepam  primarily at bedtime, about once or twice a week, due to limited supply. She underwent a gingivectomy around Thanksgiving, which has affected her sleep, diet, and fluid intake.    She reports a high resting heart rate, often around 150 bpm, which she monitors using an [employer] ring. Her heart rate decreases to around 71 bpm at rest but spikes during activities, even when not exercising.    She has a history of fainting, with a recent episode at the end of December, which she attributes to poor hydration and anxiety. She acknowledges that her hydration is not optimal, which she believes contributes to her high heart rate and fainting episodes.    She consumes about two cups of coffee daily, which she describes as more milk and vanilla than coffee. She drinks alcohol socially but not frequently. Her sleep varies, with better sleep at home compared to when she is in [place], averaging around seven  hours per night.       The following sections were reviewed this encounter by the provider:   Tobacco  Allergies  Meds  Problems  Med Hx  Surg Hx  Fam Hx             Ht 1.626 m (5' 4) Comment: Per patient  Wt 61.2 kg (135 lb) Comment: Per patient  LMP  (LMP Unknown)   BMI 23.17 kg/m     Objective:     Physical Exam  Constitutional:       Appearance: Normal appearance.   HENT:      Head: Normocephalic and atraumatic.      Nose: Nose normal.   Pulmonary:      Effort: Pulmonary effort is normal.   Musculoskeletal:      Cervical back: Normal range of motion and neck supple.   Neurological:      General: No focal deficit present.      Mental Status: She is alert.   Psychiatric:         Mood and Affect: Mood normal.         Behavior: Behavior normal.         Thought Content: Thought content normal.       Physical Exam        Results  PATHOLOGY  Gingivectomy: Completed in two stages, healing ongoing (06/2023)  Assessment:     1. GAD (generalized anxiety disorder)  - escitalopram  (LEXAPRO ) 10 MG tablet; Take 1 tablet (10 mg) by mouth daily  Dispense: 90 tablet; Refill: 1  - clonazePAM  (KlonoPIN ) 1 MG tablet; Take 1 tablet (1 mg) by mouth nightly as needed for Anxiety  Dispense: 30 tablet; Refill: 2  - propranolol  (INDERAL  LA) 60 MG 24 hr capsule; Take 1 capsule (60 mg) by mouth nightly  Dispense: 90 capsule; Refill: 1    2. Nausea  - ondansetron  (ZOFRAN -ODT) 4 MG disintegrating tablet; Take 1 tablet (4 mg) by mouth every 8 (eight) hours as needed for Nausea  Dispense: 20 tablet; Refill: 1    3. Moderate episode of recurrent major depressive disorder  - escitalopram  (LEXAPRO ) 10 MG tablet; Take 1 tablet (10 mg) by mouth daily  Dispense: 90 tablet; Refill: 1    4. Tachycardia        Plan:     Assessment & Plan  Anxiety and Depression  High stress levels with increased heart rate. Difficulty tolerating Lexapro  due to nausea. Infrequent use of Clonazepam  due to limited supply.  -Resume Lexapro  at half dose for  two weeks with concurrent use of Zofran  ODT for nausea control.  -Consider increasing Lexapro  to full dose after two weeks if tolerated.  -Refill Clonazepam  prescription for use as needed.  -Consider daily use of Propranolol  at bedtime to manage high heart rate.    Dehydration  History of fainting episodes, possibly related to poor hydration and anxiety.  -Increase daily water intake to at least 2 liters.  -Consider decaffeinated teas as an alternative to plain water.    Caffeine and Alcohol Use  Consumes two cups of coffee daily and alcohol socially.  -Reduce coffee intake to one cup daily, consumed after eating something.  -Continue limited alcohol use.    Sleep Disturbance  Average of seven hours of sleep per night, with better sleep quality at home than in [place].  -Continue use of Clonazepam  for sleep as needed.  -Consider strategies to improve sleep hygiene, particularly while in [place].    Post-Operative Recovery  Recent gingivectomy with ongoing healing.  -Continue to monitor healing process.  -Ensure adequate hydration and nutrition to support healing.    Follow-up  Monitor response to medication adjustments and overall mental and physical health.  -Check in after two weeks to assess tolerance of Lexapro  at half dose and effectiveness of Zofran  ODT for nausea control.  -Consider further adjustments to medication regimen as needed.      Damien JONELLE Ding, MD

## 2023-10-24 ENCOUNTER — Telehealth (INDEPENDENT_AMBULATORY_CARE_PROVIDER_SITE_OTHER): Payer: Commercial Managed Care - POS | Admitting: Family Medicine

## 2024-04-20 NOTE — Progress Notes (Signed)
 Gyn Annual Examination    CC: Gynecologic Annual exam     HPI: Christina [name] is a 28 y.o. G0P0000 who presents today for a routine Gyn annual exam. Reports her last gyn annual was 1.5 years ago.    Has Mirena IUD since 2020. Was amenorrheic since placement until regular sexual activity 4 months ago - approx weekly. Since then has had regular brown spotting eery day or over other day. Has postcoital bleeding - red or brown at times, but also occurs outside of intercourse as well. Has intermittent cramping not correlated with amount of bleeding. Uses heating pad, doesn't take meds. Denies vaginal itching, burning, odor.     Unable to achieve orgasm with penetrative intercourse. Has tried using a vibrator on her own without success. Feels sexually aroused and endorses increased vaginal secretions during intercourse, but has not achieved orgasm.    No LMP recorded. (Menstrual status: IUD).     The patient is premenopausal:   Menstrual cycle: as above  Intermenstrual bleeding: yes as above  Dysmenorrhea: yes - as above  Sexually active: Yes female partner  Dyspareunia/Sexual dysfunction: No  Contraception: Mirena placed 2020. Patient reports she is happy with this method.   Gynecologic problems: None    Screening:  Most recent pap: 2 years ago normal per pt  Pap history:   Denies hx abnormal paps  Gardasil: x1 dose 03/2023, pt thinks complete as teen  STI: denies hx, accepts vaginal testing  Mammogram: N/A  DEXA: N/A  Colonoscopy: N/A  Exercise/dietary habits: exercises regularly.  Smoking status: No  Domestic violence/sexual abuse/mental: No    Review of Systems:  Comprehensive ROS was performed and is negative except as stated in HPI.    Review of patient's past medical and surgical history indicates:  Past Medical History[1]    Past Surgical History[2]                                            Current Rx[3]   Allergies[4]  Family History[5]  Social History:    Marital Status: Single                  Number of children:                Occupation: Producer, Television/film/video                                     Tobacco Use: Never           Alcohol Use: Yes                Comment: socially    Drug Use:    Never           Sexually Active: Yes             Partners with: Female       Birth Control/Protection: Condom, I.U.D.    Vital Signs:  BP 101/73   Wt 124 lb 6.4 oz (56.4 kg)   BMI 21.35 kg/m   Body mass index is 21.35 kg/m.    Physical Exam:  General: well appearing, no acute distress  Respiratory: normal effort  Cardiac: normal rate.  Abdomen: soft, non-tender, no masses  Breast/Axilla: no palpable masses but generally fibrocystic tissue, non-tender, normal appearing skin, no dimpling,  no abnormal changes in nipples bilaterally  Extremities: no lower extremity edema bilaterally     Pelvic:  Below examination performed without chaperone: declines    Ext Gen: appears normal, bartholin's glands unremarkable  Urethra/Urethral Meatus: appear normal  Vagina: normal physiologic discharge, no vaginal lesions, mucosa moist with normal rugae  Cervix:  appears normal without lesions, non-friable, IUD strings at os  Uterus: Normal in size, shape, non-tender, anteverted.  Adnexa: non-tender, ?R adnexal fullness  Rectal: deferred    Assessment and Plan:  Premenopausal Annual Gyn Exam:  - Routine HM w/ PCP  - Reviewed PAP history. Discussed PAP guidelines. PAP performed..   - STI screening: accepts vaginal screen  - Contraception: Mirena IUD in place  - No indication for early breast cancer screening. Reviewed mammograms and discussed recommended yearly screening after age 23, breast self-awareness.  - Recommend routine colonoscopy screening after age 12.  - Recommended routine osteoporosis screening with DEXA at age 38.  - Discussed healthy eating habits.  - Discussed benefits of moderate exercise at least 30 mins x5 days/week.    Postcoital bleeding  Abnormal uterine bleeding (AUB)  - Vaginitis swab collected  - no visible cervical lesions  - pelvic US  to eval  IUD position  - discussed possibility of bleeding given duration of IUD use >5 years. Pending eval, can consider replacement    Adnexal fullness  - pelvic US  for eval    Anorgasmia of female  - reviewed lack of orgasm with penetrative sex alone is not uncommon and NORMAL  - reviewed alternate ways patients can experience orgasm including direct clitoral stimulation in conjunction with penetrative intercourse  - discussed use of vibrator to aid in stimulation  - reassurance provided that she does not have abnormal sexual function, but can take time to discern what stimulation for her specifically can lead to orgasm    RTO 2 months for f/up  ---    Saddie Cumming, MD             [1]  Past Medical History:  Diagnosis Date   . Anxiety and depression    [2]  Past Surgical History:  Procedure Laterality Date   . HX CHOLECYSTECTOMY     . HX RHINOPLASTY     . HX SEPTOPLASTY     . HX TONSILLECTOMY     [3]  No current outpatient medications on file.     No current facility-administered medications for this visit.   [4]  No Known Allergies  [5]  Family History  Problem Relation Name Age of Onset   . High Blood Pressure Mother     . High Blood Pressure Father     . Heart Disease Maternal Grandmother     . Lung Cancer Maternal Grandmother     . Thyroid Maternal Grandmother     . Lung Cancer Paternal Grandfather     . Breast Cancer Negative History     . Colon Cancer Negative History     . Uterine Cancer Negative History     . Ovarian Cancer Negative History

## 2024-07-20 ENCOUNTER — Encounter (INDEPENDENT_AMBULATORY_CARE_PROVIDER_SITE_OTHER): Payer: Self-pay

## 2024-07-20 ENCOUNTER — Ambulatory Visit (INDEPENDENT_AMBULATORY_CARE_PROVIDER_SITE_OTHER)

## 2024-07-20 VITALS — BP 110/68 | HR 75 | Temp 98.1°F | Resp 16 | Ht 65.0 in | Wt 126.6 lb

## 2024-07-20 DIAGNOSIS — Z0000 Encounter for general adult medical examination without abnormal findings: Secondary | ICD-10-CM

## 2024-07-20 DIAGNOSIS — Z23 Encounter for immunization: Secondary | ICD-10-CM

## 2024-07-20 DIAGNOSIS — F411 Generalized anxiety disorder: Secondary | ICD-10-CM

## 2024-07-20 MED ORDER — CLONAZEPAM 1 MG PO TABS
1.0000 mg | ORAL_TABLET | Freq: Every evening | ORAL | 1 refills | Status: AC | PRN
Start: 2024-07-20 — End: ?

## 2024-07-20 MED ORDER — DESVENLAFAXINE SUCCINATE ER 25 MG PO TB24: 25.0000 mg | ORAL_TABLET | Freq: Every day | ORAL | 0 refills | Status: DC

## 2024-07-20 NOTE — Progress Notes (Signed)
 [facility]            Date of Exam: 07/20/2024 3:26 PM        Patient ID: Christina [name] is a 28 y.o. female.  Attending Physician: Chiquita JONETTA Clause, FNP        Chief Complaint:    Chief Complaint   Patient presents with    Annual Exam          Vanishing Tip Click a link below to be taken to that activity or part of the chart   Chart Review  Order Review  Review Flowsheets  Labs  Health Maintenance  Immunizations    Allergies  Medications  Problem List  History :[id number]}           HPI:    HPI  Visit Type: Health Maintenance Visit    Work Status: in grad school full time    Reported Health:  good health  Reported Diet: Overall healthy, well balanced   Reported Exercise: walks frequently but otherwise   Sleeping: sleep difficulties d/t anxiety; both falling and maintaining sleep. About 6-7ish hours at night but still wakes up fatigued.     Dental: routine exam every 6 months- Up-to-date  Vision: routine eye exam in last year- Not applicable and Not medically indicated    Depression Screening:  PHQ2-9 Depression Screening                           Immunization Status: immunizations up to date, [place] of [place]    Reproductive Health Issues: sexually active and has an IUD; rarely has periods with IUD. Denies STI testing today.     Pap smear: recently done October 2025 at GYN in [place]- normal results.   No Cervical Cancer Screening results to display.     Mammogram: Due at age 82    Family History:   No family history of colon cancer , No family history of colon polyps, No family history of breast cancer , No significant family history of diabetes , and No significant family history of heart disease     ADDITIONAL  CONCERNS:    Reports increased anxiety. Last seen Feb 2025 by PCP- shortly after stopped taking Lexapro  d/t side effects of nausea and GI upset. Hasn't been on anything since ~March 2025. Pt reports she constantly feels on edge. Weekly panic  attacks with high heart rate. Difficulty sleeping. No suicidal thoughts or ideation. Klonopin  helped with sleep in the past but is interested in trying a new medication for long term control of anxiety. Felt nauseated on Lexapro  and Zoloft . Also has tried Prozac with similar difficulties. Is in therapy currently.             Problem List:    Problem List[1]          Current Meds:    Medications Taking[2]       Allergies:    Allergies[3]          Past Surgical History:    Past Surgical History[4]        Family History:    Family History[5]        Social History:    Social History[6]        The following sections were reviewed this encounter by the provider:            Vital Signs:    BP 110/68 (Cuff Size: Medium)  Pulse 75   Temp 98.1 F (36.7 C) (Temporal)   Resp 16   Ht 1.651 m (5' 5)   Wt 57.4 kg (126 lb 9.6 oz)   LMP  (LMP Unknown)   SpO2 98%   BMI 21.07 kg/m          ROS:    Review of Systems   Constitutional:  Negative for appetite change, chills, fatigue, fever and unexpected weight change.   HENT:  Negative for congestion, postnasal drip, sore throat and trouble swallowing.    Eyes:  Negative for visual disturbance.   Respiratory:  Negative for cough, chest tightness, shortness of breath and wheezing.    Cardiovascular:  Positive for palpitations (with panic attacks). Negative for chest pain and leg swelling.   Gastrointestinal:  Negative for abdominal pain, blood in stool, constipation, diarrhea, nausea and vomiting.   Endocrine: Negative for cold intolerance, heat intolerance, polydipsia, polyphagia and polyuria.   Genitourinary:  Negative for decreased urine volume, difficulty urinating, dysuria and flank pain.   Musculoskeletal:  Negative for arthralgias and myalgias.   Skin:  Negative for color change and rash.   Neurological:  Negative for dizziness, syncope, weakness, numbness and headaches.   Hematological:  Negative for adenopathy. Does not bruise/bleed easily.   Psychiatric/Behavioral:   Positive for sleep disturbance. Negative for dysphoric mood, self-injury and suicidal ideas. The patient is nervous/anxious.                 Physical Exam:    Physical Exam  Vitals and nursing note reviewed.   Constitutional:       General: She is not in acute distress.     Appearance: Normal appearance. She is not ill-appearing.   HENT:      Head: Normocephalic.      Right Ear: Tympanic membrane, ear canal and external ear normal.      Left Ear: Tympanic membrane, ear canal and external ear normal.      Mouth/Throat:      Mouth: Mucous membranes are moist.      Pharynx: No posterior oropharyngeal erythema.   Eyes:      Extraocular Movements: Extraocular movements intact.      Conjunctiva/sclera: Conjunctivae normal.      Pupils: Pupils are equal, round, and reactive to light.   Neck:      Thyroid: No thyroid mass, thyromegaly or thyroid tenderness.   Cardiovascular:      Rate and Rhythm: Normal rate and regular rhythm.      Pulses: Normal pulses.      Heart sounds: Normal heart sounds.   Pulmonary:      Effort: Pulmonary effort is normal.      Breath sounds: Normal breath sounds.   Abdominal:      General: Bowel sounds are normal.      Palpations: Abdomen is soft.   Musculoskeletal:         General: Normal range of motion.      Cervical back: Normal range of motion and neck supple.   Lymphadenopathy:      Cervical: No cervical adenopathy.   Skin:     General: Skin is warm and dry.      Capillary Refill: Capillary refill takes less than 2 seconds.      Findings: No bruising or rash.   Neurological:      General: No focal deficit present.      Mental Status: She is alert and oriented to person, place, and time.  Motor: No weakness.      Coordination: Coordination normal.      Gait: Gait normal.      Deep Tendon Reflexes: Reflexes normal.   Psychiatric:         Mood and Affect: Mood is anxious.         Behavior: Behavior normal.                 Assessment:    1. Annual physical exam    2. Need for influenza  vaccination    3. GAD (generalized anxiety disorder)  - clonazePAM  (KlonoPIN ) 1 MG tablet; Take 1 tablet (1 mg) by mouth at bedtime as needed for Anxiety  Dispense: 30 tablet; Refill: 1  - desvenlafaxine 25 MG Tablet SR 24 hr; Take 1 tablet (25 mg) by mouth once daily  Dispense: 30 tablet; Refill: 0  - Follow Up In Primary Care; Future          Plan:    Health Maintenance:  Focus on a well balanced diet with plenty of fresh fruits and vegetables, lean protein, and healthy fats. Chose whole grains over processed grains like white bread, pasta, and rice. Limit intake of processed, salty, sugary foods and foods high in saturated fats.   Strive for at least 150 minutes of intentional, aerobic exercise weekly.   Cervical cancer screening is UTD.   Immunizations UTD.    Anxiety:  -Did not tolerate SSRIs in the past, will trial an SNRI  -Desvenlafaxine prescribed- will start with 25mg  daily.  -Discussed directions and side effects including GI side effects that generally improve within 7-10 days, and the increased risk of suicidal thoughts. Pt verbalized understanding.  -Plan for follow up in 1 month when back home in Dec; if tolerating well can increase to 50mg . 30 day med supply given for now.  -Pt will send My Chart message with any side effects or concerns while back in [place] for school   -Klonopin  PRN for panic attacks and sleep- discussed goal is short term, infrequent use of this with starting new meds for long term control to avoid tolerance or dependence.   -Recommend continued therapy and good sleep hygiene.    All questions answered.             Follow-up:    Return in about 4 weeks (around 08/17/2024) for anxiety follow up.         Chiquita JONETTA Clause, FNP                     [1]   Patient Active Problem List  Diagnosis    Slow transit constipation    Moderate episode of recurrent major depressive disorder (CMS/HCC)    IUD (intrauterine device) in place    GAD (generalized anxiety disorder)    History of pericarditis     Tachycardia   [2]   Outpatient Medications Marked as Taking for the 07/20/24 encounter (Office Visit) with Clause Chiquita JONETTA, FNP   Medication Sig Dispense Refill    busPIRone  (BUSPAR ) 10 MG tablet Take 2 tablets (20 mg) by mouth 2 (two) times daily 360 tablet 1    levonorgestrel (Mirena, 52 MG,) 20 MCG/24HR IUD Place vaginally once      propranolol  (INDERAL  LA) 60 MG 24 hr capsule Take 1 capsule (60 mg) by mouth nightly 90 capsule 1    [DISCONTINUED] clonazePAM  (KlonoPIN ) 1 MG tablet Take 1 tablet (1 mg) by mouth nightly as needed for Anxiety 30  tablet 2    [DISCONTINUED] escitalopram  (LEXAPRO ) 10 MG tablet Take 1 tablet (10 mg) by mouth daily 90 tablet 1   [3]   Allergies  Allergen Reactions    Sertraline  Dizziness     Felt sick   [4]   Past Surgical History:  Procedure Laterality Date    CHOLECYSTECTOMY  2013    RHINOPLASTY  2020    SEPTOPLASTY  2020    TONSILLECTOMY      2007   [5]   Family History  Problem Relation Name Age of Onset    Hypertension Mother      Hypertension Father      Heart failure Maternal Grandfather      Cancer Paternal Grandmother      Cancer Paternal Grandfather     [6]   Social History  Tobacco Use    Smoking status: Never     Passive exposure: Never    Smokeless tobacco: Never   Vaping Use    Vaping status: Never Used   Substance Use Topics    Alcohol use: Yes     Comment: occasionally    Drug use: No

## 2024-08-03 ENCOUNTER — Ambulatory Visit (INDEPENDENT_AMBULATORY_CARE_PROVIDER_SITE_OTHER): Admitting: Family Medicine

## 2024-08-11 ENCOUNTER — Other Ambulatory Visit (INDEPENDENT_AMBULATORY_CARE_PROVIDER_SITE_OTHER): Payer: Self-pay

## 2024-08-11 DIAGNOSIS — F411 Generalized anxiety disorder: Secondary | ICD-10-CM

## 2024-08-13 ENCOUNTER — Encounter (INDEPENDENT_AMBULATORY_CARE_PROVIDER_SITE_OTHER): Payer: Self-pay

## 2024-08-13 DIAGNOSIS — Z130 Encounter for screening for diseases of the blood and blood-forming organs and certain disorders involving the immune mechanism: Secondary | ICD-10-CM

## 2024-08-13 DIAGNOSIS — Z131 Encounter for screening for diabetes mellitus: Secondary | ICD-10-CM

## 2024-08-13 DIAGNOSIS — Z8639 Personal history of other endocrine, nutritional and metabolic disease: Secondary | ICD-10-CM

## 2024-08-13 DIAGNOSIS — Z13220 Encounter for screening for lipoid disorders: Secondary | ICD-10-CM

## 2024-08-13 DIAGNOSIS — Z13228 Encounter for screening for other metabolic disorders: Secondary | ICD-10-CM

## 2024-08-13 NOTE — Progress Notes (Deleted)
 [facility]                Date of Exam: 08/14/2024 8:47 PM        Patient ID: Christina [name] is a 28 y.o. female.  Attending Physician: Chiquita JONETTA Clause, FNP        Chief Complaint:    No chief complaint on file.              HPI:    HPI          Problem List:    Problem List[1]          Current Meds:    Medications Taking[2]       Allergies:    Allergies[3]          Past Surgical History:    Past Surgical History[4]        Family History:    Family History[5]        Social History:    Social History[6]        The following sections were reviewed this encounter by the provider:            Vital Signs:    LMP  (LMP Unknown)          ROS:    Review of Systems           Physical Exam:    Physical Exam         Assessment:    There are no diagnoses linked to this encounter.          Plan:                Follow-up:    No follow-ups on file.         Chiquita JONETTA Clause, FNP                   [1]   Patient Active Problem List  Diagnosis    Slow transit constipation    Moderate episode of recurrent major depressive disorder (CMS/HCC)    IUD (intrauterine device) in place    GAD (generalized anxiety disorder)    History of pericarditis    Tachycardia   [2]   No outpatient medications have been marked as taking for the 08/14/24 encounter (Appointment) with Clause Chiquita JONETTA, FNP.   [3]   Allergies  Allergen Reactions    Sertraline  Dizziness     Felt sick   [4]   Past Surgical History:  Procedure Laterality Date    CHOLECYSTECTOMY  2013    RHINOPLASTY  2020    SEPTOPLASTY  2020    TONSILLECTOMY      2007   [5]   Family History  Problem Relation Name Age of Onset    Hypertension Mother      Hypertension Father      Heart failure Maternal Grandfather      Cancer Paternal Grandmother      Cancer Paternal Grandfather     [6]   Social History  Tobacco Use    Smoking status: Never     Passive exposure: Never    Smokeless tobacco: Never   Vaping Use    Vaping status: Never Used   Substance Use  Topics    Alcohol use: Yes     Comment: occasionally    Drug use: No

## 2024-08-14 ENCOUNTER — Ambulatory Visit (INDEPENDENT_AMBULATORY_CARE_PROVIDER_SITE_OTHER)

## 2024-08-14 NOTE — Telephone Encounter (Signed)
 Request for 90 day supply    Medication(s) requested: desvenlafaxine  25 MG Tablet SR 24 hr   Medication(s) last refilled: 07/20/24 #30 x0   Last visit for this medication: 07/20/24 annual including GAD  Due for follow-up: 1 month   Upcoming appt and reason:  08/14/24     See pt message 08/13/24

## 2024-08-30 ENCOUNTER — Telehealth (INDEPENDENT_AMBULATORY_CARE_PROVIDER_SITE_OTHER)

## 2024-09-14 ENCOUNTER — Telehealth (INDEPENDENT_AMBULATORY_CARE_PROVIDER_SITE_OTHER)

## 2024-09-14 DIAGNOSIS — F331 Major depressive disorder, recurrent, moderate: Secondary | ICD-10-CM

## 2024-09-14 DIAGNOSIS — F411 Generalized anxiety disorder: Secondary | ICD-10-CM

## 2024-09-14 NOTE — Progress Notes (Signed)
 [facility]                Date of Virtual Visit: 09/14/2024 11:09 AM        Patient ID: Christina [name] is a 29 y.o. female.  Attending Physician: Chiquita JONETTA Clause, FNP       Telehealth:      The Patient has given verbal consent for delivery of health care via telehealth.   The patient is located at Home in [place]   The encounter provider is located at their Medical Office in [place]   Epic Video Client was utilized for Real Time/Synchronous Telehealth.          Chief Complaint:    Chief Complaint   Patient presents with    Anxiety        History of Present Illness  Christina [name] is a 29 year old female who presents for a follow-up on anxiety management.    Pharmacologic management of anxiety/depression  - Currently taking desvenlafaxine  25 mg daily for approximately one month, administered around 10 AM.  - Desvenlafaxine  is more effective than previous medications for anxiety. Tolerating well.   - Mild nausea as a side effect managed with over-the-counter Dramamine, allowing normal eating.  - Uses Klonopin  a couple of times per week for acute panic episodes.  - Uses propranolol  immediate-release for physical symptoms of anxiety, such as increased heart rate; provides temporary relief but effect is short-lived.    Anxiety symptoms and panic episodes  - Still experiencing persistent anxiety with frequent feelings of panic and being on edge, especially in academic settings.  - Panic episodes occur a couple of times per week, sometimes requiring use of Klonopin .  - During panic episodes, heart rate spikes up to 146 bpm; baseline heart rate averages 90-100 bpm.  - Klonopin  prevents crying during panic episodes but does not reduce elevated heart rate.  - Increased anxiety attributed to a stressful semester and upcoming competency exams.  - Monitors heart rate with aura ring, noting close correlation between anxiety and heart rate.  - Attends therapy sessions twice weekly due to  increased anxiety levels.      Medications:     Medications Taking[1]      Allergies:     Allergies[2]       The following sections were reviewed this encounter by the provider:          Vital signs:     There were no vitals taken for this visit.      Physical Exam:       Physical Exam  Constitutional:       General: She is not in acute distress.     Appearance: Normal appearance. She is not ill-appearing.   HENT:      Head: Normocephalic.   Eyes:      Conjunctiva/sclera: Conjunctivae normal.   Pulmonary:      Effort: Pulmonary effort is normal.   Skin:     General: Skin is warm and dry.   Neurological:      General: No focal deficit present.      Mental Status: She is alert and oriented to person, place, and time. Mental status is at baseline.   Psychiatric:         Mood and Affect: Mood is anxious.         Speech: Speech normal.         Behavior: Behavior normal.  Assessment:     1. GAD (generalized anxiety disorder)    2. 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    Persistent symptoms despite desvenlafaxine  25 mg daily for four weeks. Mild nausea managed with Dramamine. Panic episodes managed with Klonopin  and propranolol . Heart rate elevated during panic, reaching 146 bpm. Propranolol  60 mg extended release considered for baseline heart rate control.   - Continue desvenlafaxine  25 mg daily. May take 4-6 weeks for full effect.  - Continue Klonopin  as needed for panic episodes.  - Start propranolol  60 mg extended release daily in the morning to manage baseline heart rate.  - Continue therapy sessions twice weekly   - Scheduled virtual follow-up in one month to reassess symptoms and medication efficacy.  - Could consider increasing dose of desvenlafaxine  to 50mg  for further efficacy at next follow up if side effects of nausea have improved.       Follow-up:     Return in 5 weeks (on 10/19/2024) for  virtual anxiety follow up.      Chiquita JONETTA Clause, FNP      Consent was obtained to record this visit.                  [1]   No outpatient medications have been marked as taking for the 09/14/24 encounter (Telemedicine Visit) with Clause Chiquita JONETTA, FNP.   [2]   Allergies  Allergen Reactions    Sertraline  Dizziness     Felt sick

## 2024-10-19 ENCOUNTER — Telehealth (INDEPENDENT_AMBULATORY_CARE_PROVIDER_SITE_OTHER)
